# Patient Record
Sex: Male | Born: 1935 | Race: White | Hispanic: No | Smoking: Never smoker
Health system: Southern US, Community
[De-identification: ages and names within clinical notes are randomized; demographics above are authoritative.]

## PROBLEM LIST (undated history)

## (undated) DIAGNOSIS — E039 Hypothyroidism, unspecified: Secondary | ICD-10-CM

## (undated) DIAGNOSIS — L98419 Non-pressure chronic ulcer of buttock with unspecified severity: Secondary | ICD-10-CM

## (undated) DIAGNOSIS — H8109 Meniere's disease, unspecified ear: Secondary | ICD-10-CM

## (undated) DIAGNOSIS — F22 Delusional disorders: Secondary | ICD-10-CM

## (undated) DIAGNOSIS — R001 Bradycardia, unspecified: Secondary | ICD-10-CM

## (undated) DIAGNOSIS — F0390 Unspecified dementia without behavioral disturbance: Secondary | ICD-10-CM

## (undated) DIAGNOSIS — F0150 Vascular dementia without behavioral disturbance: Secondary | ICD-10-CM

## (undated) DIAGNOSIS — M1990 Unspecified osteoarthritis, unspecified site: Secondary | ICD-10-CM

## (undated) DIAGNOSIS — I639 Cerebral infarction, unspecified: Secondary | ICD-10-CM

## (undated) HISTORY — DX: Vascular dementia, unspecified severity, without behavioral disturbance, psychotic disturbance, mood disturbance, and anxiety: F01.50

## (undated) HISTORY — DX: Delusional disorders: F22

## (undated) HISTORY — PX: EXCISIONAL HEMORRHOIDECTOMY: SHX1541

## (undated) HISTORY — DX: Bradycardia, unspecified: R00.1

## (undated) HISTORY — DX: Meniere's disease, unspecified ear: H81.09

## (undated) HISTORY — DX: Non-pressure chronic ulcer of buttock with unspecified severity: L98.419

## (undated) HISTORY — DX: Unspecified osteoarthritis, unspecified site: M19.90

---

## 2019-10-14 ENCOUNTER — Other Ambulatory Visit: Payer: Self-pay

## 2019-10-14 ENCOUNTER — Ambulatory Visit (INDEPENDENT_AMBULATORY_CARE_PROVIDER_SITE_OTHER): Payer: Medicare Other | Admitting: Otolaryngology

## 2019-10-14 ENCOUNTER — Encounter (INDEPENDENT_AMBULATORY_CARE_PROVIDER_SITE_OTHER): Payer: Self-pay | Admitting: Otolaryngology

## 2019-10-14 VITALS — Temp 97.3°F

## 2019-10-14 DIAGNOSIS — H6121 Impacted cerumen, right ear: Secondary | ICD-10-CM | POA: Diagnosis not present

## 2019-10-14 DIAGNOSIS — H6123 Impacted cerumen, bilateral: Secondary | ICD-10-CM | POA: Diagnosis not present

## 2019-10-14 NOTE — Progress Notes (Signed)
HPI: Paul Hopkins is a 86 y.o. male who presents for evaluation of wax buildup worse on the right side referred by [facility]..  No past medical history on file.  Social History   Socioeconomic History  . Marital status: Widowed    Spouse name: Not on file  . Number of children: Not on file  . Years of education: Not on file  . Highest education level: Not on file  Occupational History  . Not on file  Tobacco Use  . Smoking status: Never Smoker  . Smokeless tobacco: Never Used  Substance and Sexual Activity  . Alcohol use: Not on file  . Drug use: Not on file  . Sexual activity: Not on file  Other Topics Concern  . Not on file  Social History Narrative  . Not on file   Social Determinants of Health   Financial Resource Strain:   . Difficulty of Paying Living Expenses:   Food Insecurity:   . Worried About Programme researcher, broadcasting/film/video in the Last Year:   . Barista in the Last Year:   Transportation Needs:   . Freight forwarder (Medical):   Marland Kitchen Lack of Transportation (Non-Medical):   Physical Activity:   . Days of Exercise per Week:   . Minutes of Exercise per Session:   Stress:   . Feeling of Stress :   Social Connections:   . Frequency of Communication with Friends and Family:   . Frequency of Social Gatherings with Friends and Family:   . Attends Religious Services:   . Active Member of Clubs or Organizations:   . Attends Banker Meetings:   Marland Kitchen Marital Status:    No family history on file. Allergies  Allergen Reactions  . Amoxicillin   . Ciprofloxacin   . Sulfa Antibiotics   . Trimethoprim    Prior to Admission medications   Not on File     Positive ROS: Otherwise negative  All other systems have been reviewed and were otherwise negative with the exception of those mentioned in the HPI and as above.  Physical Exam: Constitutional: Alert, well-appearing, no acute distress Ears: External ears without lesions or tenderness. Ear  canals revealed a mild amount of wax on the right side and minimal wax on the left side.  This was cleaned with forceps and curettes.  TMs were otherwise clear.. Nasal: External nose without lesions. Clear nasal passages Oral: Oropharynx clear. Neck: No palpable adenopathy or masses Respiratory: Breathing comfortably  Skin: No facial/neck lesions or rash noted.  Cerumen impaction removal  Date/Time: 10/14/2019 5:22 PM Performed by: Drema Halon, MD Authorized by: Drema Halon, MD   Consent:    Consent obtained:  Verbal   Consent given by:  Patient   Risks discussed:  Pain and bleeding Procedure details:    Location:  L ear and R ear   Procedure type: curette and forceps   Post-procedure details:    Inspection:  TM intact and canal normal   Hearing quality:  Improved   Patient tolerance of procedure:  Tolerated well, no immediate complications Comments:     TMs were clear bilaterally.    Assessment: Cerumen buildup right side worse than left.  Plan: This was cleaned in the office.  He will follow-up as needed.  Narda Bonds, MD

## 2019-11-25 ENCOUNTER — Emergency Department (HOSPITAL_COMMUNITY)
Admission: EM | Admit: 2019-11-25 | Discharge: 2019-11-25 | Disposition: A | Discharge status: Home / Self Care | Payer: Medicare Other | Source: Home / Self Care | Attending: Emergency Medicine | Admitting: Emergency Medicine

## 2019-11-25 ENCOUNTER — Emergency Department (HOSPITAL_COMMUNITY): Payer: Medicare Other

## 2019-11-25 ENCOUNTER — Encounter (HOSPITAL_COMMUNITY): Payer: Self-pay | Admitting: Emergency Medicine

## 2019-11-25 ENCOUNTER — Other Ambulatory Visit: Payer: Self-pay

## 2019-11-25 DIAGNOSIS — R531 Weakness: Secondary | ICD-10-CM

## 2019-11-25 DIAGNOSIS — A419 Sepsis, unspecified organism: Secondary | ICD-10-CM | POA: Diagnosis not present

## 2019-11-25 HISTORY — DX: Cerebral infarction, unspecified: I63.9

## 2019-11-25 HISTORY — DX: Unspecified dementia, unspecified severity, without behavioral disturbance, psychotic disturbance, mood disturbance, and anxiety: F03.90

## 2019-11-25 LAB — COMPREHENSIVE METABOLIC PANEL
ALT: 18 U/L (ref 0–44)
AST: 27 U/L (ref 15–41)
Albumin: 3.9 g/dL (ref 3.5–5.0)
Alkaline Phosphatase: 54 U/L (ref 38–126)
Anion gap: 7 (ref 5–15)
BUN: 32 mg/dL — ABNORMAL HIGH (ref 8–23)
CO2: 30 mmol/L (ref 22–32)
Calcium: 9.6 mg/dL (ref 8.9–10.3)
Chloride: 103 mmol/L (ref 98–111)
Creatinine, Ser: 1.21 mg/dL (ref 0.61–1.24)
GFR calc Af Amer: >60 mL/min (ref >60)
GFR calc non Af Amer: 55 mL/min — ABNORMAL LOW (ref >60)
Glucose, Bld: 117 mg/dL — ABNORMAL HIGH (ref 70–99)
Potassium: 4.7 mmol/L (ref 3.5–5.1)
Sodium: 140 mmol/L (ref 135–145)
Total Bilirubin: 0.9 mg/dL (ref 0.3–1.2)
Total Protein: 6.2 g/dL — ABNORMAL LOW (ref 6.5–8.1)

## 2019-11-25 LAB — CBG MONITORING, ED: Glucose-Capillary: 94 mg/dL (ref 70–99)

## 2019-11-25 LAB — CBC WITH DIFFERENTIAL/PLATELET
Abs Immature Granulocytes: 0.01 10*3/uL (ref 0.00–0.07)
Basophils Absolute: 0 10*3/uL (ref 0.0–0.1)
Basophils Relative: 1 %
Eosinophils Absolute: 0 10*3/uL (ref 0.0–0.5)
Eosinophils Relative: 1 %
HCT: 44.9 % (ref 39.0–52.0)
Hemoglobin: 14.5 g/dL (ref 13.0–17.0)
Immature Granulocytes: 0 %
Lymphocytes Relative: 17 %
Lymphs Abs: 0.8 10*3/uL (ref 0.7–4.0)
MCH: 31.5 pg (ref 26.0–34.0)
MCHC: 32.3 g/dL (ref 30.0–36.0)
MCV: 97.6 fL (ref 80.0–100.0)
Monocytes Absolute: 0.3 10*3/uL (ref 0.1–1.0)
Monocytes Relative: 7 %
Neutro Abs: 3.4 10*3/uL (ref 1.7–7.7)
Neutrophils Relative %: 74 %
Platelets: 196 10*3/uL (ref 150–400)
RBC: 4.6 MIL/uL (ref 4.22–5.81)
RDW: 13.5 % (ref 11.5–15.5)
WBC: 4.6 10*3/uL (ref 4.0–10.5)
nRBC: 0 % (ref 0.0–0.2)

## 2019-11-25 LAB — URINALYSIS, ROUTINE W REFLEX MICROSCOPIC
Bilirubin Urine: NEGATIVE
Glucose, UA: NEGATIVE mg/dL
Hgb urine dipstick: NEGATIVE
Ketones, ur: NEGATIVE mg/dL
Leukocytes,Ua: NEGATIVE
Nitrite: NEGATIVE
Protein, ur: NEGATIVE mg/dL
Specific Gravity, Urine: 1.019 (ref 1.005–1.030)
pH: 6 (ref 5.0–8.0)

## 2019-11-25 MED ORDER — SODIUM CHLORIDE 0.9 % IV SOLN: INTRAVENOUS | Status: DC

## 2019-11-25 NOTE — ED Notes (Signed)
Patient verbalizes understanding of discharge instructions . Opportunity for questions and answers were provided . Armband removed by staff ,Pt discharged from ED. W/C  offered at D/C  and Declined W/C at D/C and was escorted to lobby by RN.  

## 2019-11-25 NOTE — ED Notes (Signed)
Tc to [facility]  forup date on Pt.  CNA reported Pt is ambulatory but needed assistance this AM with a WC to return to his room . When staff returned Pt to his room, it was reported that the room was in disorder. Items in room were tossed around room and room looked ransacked. Pt is known to have dementia . CNA reported Pt does not talk because he is heard of hearing and has to read lips.

## 2019-11-25 NOTE — ED Provider Notes (Signed)
[facility] EMERGENCY DEPARTMENT Provider Note   CSN: [id number] Arrival date & time: 11/25/19  0800     History Chief Complaint  Patient presents with  . Fatigue  . Weakness    Paul Hopkins is a 86 y.o. male.  86 year old male with history of dementia and CVA who presents with EMS after staff at his facility felt that he was weaker than normal.  Patient was able to use his wheelchair and wheel himself out to a meal today with a note that he is in a be somewhat more lethargic.  Patient was noted to be more weak without focality while talking to staff.  Patient himself has no complaints.  No reported history of fever, vomiting.  EMS called and patient CBG was above 100 and he was transported here.        Past Medical History:  Diagnosis Date  . Dementia (HCC)   . Stroke [facility])     There are no problems to display for this patient.        No family history on file.  Social History   Tobacco Use  . Smoking status: Never Smoker  . Smokeless tobacco: Never Used  Substance Use Topics  . Alcohol use: Not on file  . Drug use: Not on file    Home Medications Prior to Admission medications   Not on File    Allergies    Amoxicillin, Ciprofloxacin, Sulfa antibiotics, and Trimethoprim  Review of Systems   Review of Systems  Unable to perform ROS: Dementia    Physical Exam Updated Vital Signs BP (!) 163/77 (BP Location: Right Arm)   Pulse 66   Temp 98.5 F (36.9 C) (Oral)   Resp 17   Physical Exam Vitals and nursing note reviewed.  Constitutional:      General: He is not in acute distress.    Appearance: Normal appearance. He is well-developed. He is not toxic-appearing.  HENT:     Head: Normocephalic and atraumatic.  Eyes:     General: Lids are normal.     Conjunctiva/sclera: Conjunctivae normal.     Pupils: Pupils are equal, round, and reactive to light.  Neck:     Thyroid: No thyroid mass.     Trachea: No tracheal deviation.    Cardiovascular:     Rate and Rhythm: Normal rate and regular rhythm.     Heart sounds: Normal heart sounds. No murmur heard.  No gallop.   Pulmonary:     Effort: Pulmonary effort is normal. No respiratory distress.     Breath sounds: Normal breath sounds. No stridor. No decreased breath sounds, wheezing, rhonchi or rales.  Abdominal:     General: Bowel sounds are normal. There is no distension.     Palpations: Abdomen is soft.     Tenderness: There is no abdominal tenderness. There is no rebound.  Musculoskeletal:        General: No tenderness. Normal range of motion.     Cervical back: Normal range of motion and neck supple.  Skin:    General: Skin is warm and dry.     Findings: No abrasion or rash.  Neurological:     Mental Status: He is alert. He is disoriented.     GCS: GCS eye subscore is 4. GCS verbal subscore is 5. GCS motor subscore is 6.     Cranial Nerves: No cranial nerve deficit.     Sensory: No sensory deficit.     Comments: Patient  withdraws to pain in all 4 extremities  Psychiatric:        Attention and Perception: He is inattentive.        Speech: Speech normal.        Behavior: Behavior normal.     ED Results / Procedures / Treatments   Labs (all labs ordered are listed, but only abnormal results are displayed) Labs Reviewed - No data to display  EKG EKG Interpretation  Date/Time:  Monday November 25 2019 08:11:33 EDT Ventricular Rate:  56 PR Interval:    QRS Duration: 163 QT Interval:  475 QTC Calculation: 459 R Axis:   -85 Text Interpretation: Sinus rhythm Right bundle branch block Left ventricular hypertrophy Anterior infarct, old No old tracing to compare Confirmed by Lacretia Leigh ([id number]) on 11/25/2019 8:19:01 AM   Radiology No results found.  Procedures Procedures (including critical care time)  Medications Ordered in ED Medications  0.9 %  sodium chloride infusion (has no administration in time range)    ED Course  I have reviewed the  triage vital signs and the nursing notes.  Pertinent labs & imaging results that were available during my care of the patient were reviewed by me and considered in my medical decision making (see chart for details).    MDM Rules/Calculators/A&P                          Patient's evaluation here including head CT and urinalysis without acute findings.  According to the patient's daughter, he is at his baseline.  Suspect that his underlying dementia is contributing to this.  Will discharge Final Clinical Impression(s) / ED Diagnoses Final diagnoses:  None    Rx / DC Orders ED Discharge Orders    None       Lacretia Leigh, MD 11/25/19 1044

## 2019-11-25 NOTE — ED Triage Notes (Signed)
Pt here via ems from [facility] where staff said pt is lethargic and weak. Pt hx of dementia and stroke. Pt uses a wheelchair and rolled himself to the doors this morning and was talking to security and staff noticed he was weak. Pt has no complaints. Afebrile with EMS.

## 2019-11-26 LAB — URINE CULTURE: Culture: NO GROWTH

## 2019-11-27 ENCOUNTER — Emergency Department (HOSPITAL_COMMUNITY): Payer: Medicare Other

## 2019-11-27 ENCOUNTER — Inpatient Hospital Stay (HOSPITAL_COMMUNITY)
Admission: AD | Admit: 2019-11-27 | Discharge: 2019-11-29 | DRG: 872 | Disposition: A | Discharge status: Home / Self Care | Payer: Medicare Other | Attending: Family Medicine | Admitting: Family Medicine

## 2019-11-27 DIAGNOSIS — Z79890 Hormone replacement therapy (postmenopausal): Secondary | ICD-10-CM

## 2019-11-27 DIAGNOSIS — E039 Hypothyroidism, unspecified: Secondary | ICD-10-CM | POA: Diagnosis present

## 2019-11-27 DIAGNOSIS — F0150 Vascular dementia without behavioral disturbance: Secondary | ICD-10-CM | POA: Diagnosis present

## 2019-11-27 DIAGNOSIS — R17 Unspecified jaundice: Secondary | ICD-10-CM | POA: Diagnosis present

## 2019-11-27 DIAGNOSIS — R5383 Other fatigue: Secondary | ICD-10-CM | POA: Diagnosis present

## 2019-11-27 DIAGNOSIS — N390 Urinary tract infection, site not specified: Secondary | ICD-10-CM | POA: Diagnosis present

## 2019-11-27 DIAGNOSIS — Z66 Do not resuscitate: Secondary | ICD-10-CM | POA: Diagnosis present

## 2019-11-27 DIAGNOSIS — A4152 Sepsis due to Pseudomonas: Secondary | ICD-10-CM | POA: Diagnosis not present

## 2019-11-27 DIAGNOSIS — Z7189 Other specified counseling: Secondary | ICD-10-CM

## 2019-11-27 DIAGNOSIS — B965 Pseudomonas (aeruginosa) (mallei) (pseudomallei) as the cause of diseases classified elsewhere: Secondary | ICD-10-CM | POA: Diagnosis present

## 2019-11-27 DIAGNOSIS — A498 Other bacterial infections of unspecified site: Secondary | ICD-10-CM | POA: Diagnosis present

## 2019-11-27 DIAGNOSIS — A419 Sepsis, unspecified organism: Secondary | ICD-10-CM

## 2019-11-27 DIAGNOSIS — Z20822 Contact with and (suspected) exposure to covid-19: Secondary | ICD-10-CM | POA: Diagnosis present

## 2019-11-27 DIAGNOSIS — Z79899 Other long term (current) drug therapy: Secondary | ICD-10-CM | POA: Diagnosis not present

## 2019-11-27 DIAGNOSIS — F0151 Vascular dementia with behavioral disturbance: Secondary | ICD-10-CM | POA: Diagnosis not present

## 2019-11-27 DIAGNOSIS — Z515 Encounter for palliative care: Secondary | ICD-10-CM | POA: Diagnosis present

## 2019-11-27 DIAGNOSIS — R609 Edema, unspecified: Secondary | ICD-10-CM

## 2019-11-27 DIAGNOSIS — H9190 Unspecified hearing loss, unspecified ear: Secondary | ICD-10-CM | POA: Diagnosis present

## 2019-11-27 DIAGNOSIS — Z808 Family history of malignant neoplasm of other organs or systems: Secondary | ICD-10-CM | POA: Diagnosis not present

## 2019-11-27 DIAGNOSIS — Z8673 Personal history of transient ischemic attack (TIA), and cerebral infarction without residual deficits: Secondary | ICD-10-CM

## 2019-11-27 DIAGNOSIS — K7460 Unspecified cirrhosis of liver: Secondary | ICD-10-CM

## 2019-11-27 DIAGNOSIS — F01518 Vascular dementia, unspecified severity, with other behavioral disturbance: Secondary | ICD-10-CM

## 2019-11-27 DIAGNOSIS — N3000 Acute cystitis without hematuria: Secondary | ICD-10-CM

## 2019-11-27 DIAGNOSIS — R188 Other ascites: Secondary | ICD-10-CM | POA: Diagnosis present

## 2019-11-27 HISTORY — DX: Hypothyroidism, unspecified: E03.9

## 2019-11-27 LAB — URINALYSIS, ROUTINE W REFLEX MICROSCOPIC
Bilirubin Urine: NEGATIVE
Glucose, UA: NEGATIVE mg/dL
Ketones, ur: NEGATIVE mg/dL
Nitrite: NEGATIVE
Protein, ur: 100 mg/dL — AB
Specific Gravity, Urine: 1.026 (ref 1.005–1.030)
WBC, UA: >50 WBC/hpf — ABNORMAL HIGH (ref 0–5)
pH: 5 (ref 5.0–8.0)

## 2019-11-27 LAB — LACTIC ACID, PLASMA
Lactic Acid, Venous: 2.7 mmol/L (ref 0.5–1.9)
Lactic Acid, Venous: 2 mmol/L (ref 0.5–1.9)

## 2019-11-27 LAB — CBC WITH DIFFERENTIAL/PLATELET
Abs Immature Granulocytes: 0.28 10*3/uL — ABNORMAL HIGH (ref 0.00–0.07)
Basophils Absolute: 0.1 10*3/uL (ref 0.0–0.1)
Basophils Relative: 0 %
Eosinophils Absolute: 0 10*3/uL (ref 0.0–0.5)
Eosinophils Relative: 0 %
HCT: 43.5 % (ref 39.0–52.0)
Hemoglobin: 14 g/dL (ref 13.0–17.0)
Immature Granulocytes: 1 %
Lymphocytes Relative: 1 %
Lymphs Abs: 0.1 10*3/uL — ABNORMAL LOW (ref 0.7–4.0)
MCH: 31.4 pg (ref 26.0–34.0)
MCHC: 32.2 g/dL (ref 30.0–36.0)
MCV: 97.5 fL (ref 80.0–100.0)
Monocytes Absolute: 2 10*3/uL — ABNORMAL HIGH (ref 0.1–1.0)
Monocytes Relative: 9 %
Neutro Abs: 20.7 10*3/uL — ABNORMAL HIGH (ref 1.7–7.7)
Neutrophils Relative %: 89 %
Platelets: 189 10*3/uL (ref 150–400)
RBC: 4.46 MIL/uL (ref 4.22–5.81)
RDW: 13.5 % (ref 11.5–15.5)
WBC: 23.1 10*3/uL — ABNORMAL HIGH (ref 4.0–10.5)
nRBC: 0 % (ref 0.0–0.2)

## 2019-11-27 LAB — COMPREHENSIVE METABOLIC PANEL
ALT: 19 U/L (ref 0–44)
AST: 38 U/L (ref 15–41)
Albumin: 3.3 g/dL — ABNORMAL LOW (ref 3.5–5.0)
Alkaline Phosphatase: 52 U/L (ref 38–126)
Anion gap: 11 (ref 5–15)
BUN: 31 mg/dL — ABNORMAL HIGH (ref 8–23)
CO2: 26 mmol/L (ref 22–32)
Calcium: 9.3 mg/dL (ref 8.9–10.3)
Chloride: 99 mmol/L (ref 98–111)
Creatinine, Ser: 1.3 mg/dL — ABNORMAL HIGH (ref 0.61–1.24)
GFR calc Af Amer: 58 mL/min — ABNORMAL LOW (ref >60)
GFR calc non Af Amer: 50 mL/min — ABNORMAL LOW (ref >60)
Glucose, Bld: 156 mg/dL — ABNORMAL HIGH (ref 70–99)
Potassium: 4.1 mmol/L (ref 3.5–5.1)
Sodium: 136 mmol/L (ref 135–145)
Total Bilirubin: 1.9 mg/dL — ABNORMAL HIGH (ref 0.3–1.2)
Total Protein: 5.8 g/dL — ABNORMAL LOW (ref 6.5–8.1)

## 2019-11-27 LAB — SARS CORONAVIRUS 2 BY RT PCR (HOSPITAL ORDER, PERFORMED IN ~~LOC~~ HOSPITAL LAB): SARS Coronavirus 2: NEGATIVE

## 2019-11-27 LAB — APTT: aPTT: 29 seconds (ref 24–36)

## 2019-11-27 LAB — PROTIME-INR
INR: 1.2 (ref 0.8–1.2)
Prothrombin Time: 15.2 seconds (ref 11.4–15.2)

## 2019-11-27 LAB — CBG MONITORING, ED: Glucose-Capillary: 141 mg/dL — ABNORMAL HIGH (ref 70–99)

## 2019-11-27 LAB — LIPASE, BLOOD: Lipase: 23 U/L (ref 11–51)

## 2019-11-27 MED ORDER — SODIUM CHLORIDE 0.9 % IV SOLN: INTRAVENOUS | Status: AC

## 2019-11-27 MED ORDER — LEVOTHYROXINE SODIUM 88 MCG PO TABS
88.0000 ug | ORAL_TABLET | Freq: Every day | ORAL | Status: DC
  Administered 2019-11-28 – 2019-11-29 (×2): 88 ug via ORAL
  Filled 2019-11-27 (×2): qty 1

## 2019-11-27 MED ORDER — SODIUM CHLORIDE 0.9 % IV BOLUS
500.0000 mL | Freq: Once | INTRAVENOUS | Status: AC
  Administered 2019-11-27: 500 mL via INTRAVENOUS

## 2019-11-27 MED ORDER — SODIUM CHLORIDE 0.9 % IV BOLUS
1000.0000 mL | Freq: Once | INTRAVENOUS | Status: AC
  Administered 2019-11-27: 1000 mL via INTRAVENOUS

## 2019-11-27 MED ORDER — ACETAMINOPHEN 325 MG PO TABS: 650.0000 mg | ORAL_TABLET | Freq: Four times a day (QID) | ORAL | Status: DC | PRN

## 2019-11-27 MED ORDER — ARIPIPRAZOLE 5 MG PO TABS
10.0000 mg | ORAL_TABLET | Freq: Every day | ORAL | Status: DC
  Administered 2019-11-28 – 2019-11-29 (×2): 10 mg via ORAL
  Filled 2019-11-27 (×2): qty 2

## 2019-11-27 MED ORDER — ENOXAPARIN SODIUM 40 MG/0.4ML ~~LOC~~ SOLN
40.0000 mg | SUBCUTANEOUS | Status: DC
  Administered 2019-11-28 – 2019-11-29 (×2): 40 mg via SUBCUTANEOUS
  Filled 2019-11-27 (×3): qty 0.4

## 2019-11-27 MED ORDER — ACETAMINOPHEN 325 MG PO TABS
650.0000 mg | ORAL_TABLET | Freq: Once | ORAL | Status: AC
  Administered 2019-11-27: 650 mg via ORAL
  Filled 2019-11-27: qty 2

## 2019-11-27 MED ORDER — SODIUM CHLORIDE 0.9 % IV SOLN
1.0000 g | Freq: Once | INTRAVENOUS | Status: AC
  Administered 2019-11-27: 1 g via INTRAVENOUS
  Filled 2019-11-27: qty 10

## 2019-11-27 MED ORDER — SODIUM CHLORIDE 0.9 % IV SOLN: Freq: Once | INTRAVENOUS | Status: AC

## 2019-11-27 NOTE — ED Triage Notes (Signed)
Per [facility] pt seen here 3 days ago for ongoing weakness, lethargy, somnolent, dysuria, less communication with staff. They are concerned for possible UTI   BP 120/60   HR 80 RR 40 100.7 temp CBG 169 97% 2L [place]

## 2019-11-27 NOTE — ED Provider Notes (Signed)
[facility] EMERGENCY DEPARTMENT Provider Note   CSN: [id number] Arrival date & time: 11/27/19  1500     History Chief Complaint  Patient presents with  . Altered Mental Status    Gualberto Wahlen is a 86 y.o. male.  The history is provided by the patient.  Altered Mental Status Presenting symptoms: lethargy   Severity:  Mild Most recent episode:  Yesterday Episode history:  Single Timing:  Constant Progression:  Unchanged Chronicity:  New Context: dementia and nursing home resident   Associated symptoms: fever (possibly) and weakness   Associated symptoms: no abdominal pain, no nausea, no palpitations, no rash, no seizures and no vomiting        Past Medical History:  Diagnosis Date  . Dementia (HCC)   . Stroke [facility])     There are no problems to display for this patient.       No family history on file.  Social History   Tobacco Use  . Smoking status: Never Smoker  . Smokeless tobacco: Never Used  Substance Use Topics  . Alcohol use: Not on file  . Drug use: Not on file    Home Medications Prior to Admission medications   Not on File    Allergies    Amoxicillin, Ciprofloxacin, Sulfa antibiotics, and Trimethoprim  Review of Systems   Review of Systems  Constitutional: Positive for fever (possibly). Negative for chills.  HENT: Negative for ear pain and sore throat.   Eyes: Negative for pain and visual disturbance.  Respiratory: Negative for cough and shortness of breath.   Cardiovascular: Negative for chest pain and palpitations.  Gastrointestinal: Positive for constipation (possibly). Negative for abdominal pain, diarrhea, nausea and vomiting.  Genitourinary: Negative for dysuria and hematuria.  Musculoskeletal: Negative for arthralgias and back pain.  Skin: Negative for color change and rash.  Neurological: Positive for weakness. Negative for seizures and syncope.  All other systems reviewed and are negative.   Physical  Exam Updated Vital Signs  ED Triage Vitals [11/27/19 1512]  Enc Vitals Group     BP (!) 115/50     Pulse Rate 82     Resp 20     Temp (!) 100.4 F (38 C)     Temp Source Oral     SpO2 98 %     Weight      Height      Head Circumference      Peak Flow      Pain Score      Pain Loc      Pain Edu?      Excl. in GC?     Physical Exam Vitals and nursing note reviewed.  Constitutional:      General: He is not in acute distress.    Appearance: He is well-developed. He is not ill-appearing.  HENT:     Head: Normocephalic and atraumatic.     Nose: Nose normal.     Mouth/Throat:     Mouth: Mucous membranes are moist.  Eyes:     Extraocular Movements: Extraocular movements intact.     Conjunctiva/sclera: Conjunctivae normal.     Pupils: Pupils are equal, round, and reactive to light.  Cardiovascular:     Rate and Rhythm: Normal rate and regular rhythm.     Pulses: Normal pulses.     Heart sounds: Normal heart sounds. No murmur heard.   Pulmonary:     Effort: Pulmonary effort is normal. No respiratory distress.  Breath sounds: Normal breath sounds.  Abdominal:     Palpations: Abdomen is soft.     Tenderness: There is no abdominal tenderness.  Musculoskeletal:     Cervical back: Neck supple.  Skin:    General: Skin is warm and dry.  Neurological:     General: No focal deficit present.     Mental Status: He is alert.     Comments: Moves all extremities, appears to be at baseline.  Pupils equal     ED Results / Procedures / Treatments   Labs (all labs ordered are listed, but only abnormal results are displayed) Labs Reviewed  LACTIC ACID, PLASMA - Abnormal; Notable for the following components:      Result Value   Lactic Acid, Venous 2.7 (*)    All other components within normal limits  LACTIC ACID, PLASMA - Abnormal; Notable for the following components:   Lactic Acid, Venous 2.0 (*)    All other components within normal limits  COMPREHENSIVE METABOLIC PANEL  - Abnormal; Notable for the following components:   Glucose, Bld 156 (*)    BUN 31 (*)    Creatinine, Ser 1.30 (*)    Total Protein 5.8 (*)    Albumin 3.3 (*)    Total Bilirubin 1.9 (*)    GFR calc non Af Amer 50 (*)    GFR calc Af Amer 58 (*)    All other components within normal limits  CBC WITH DIFFERENTIAL/PLATELET - Abnormal; Notable for the following components:   WBC 23.1 (*)    Neutro Abs 20.7 (*)    Lymphs Abs 0.1 (*)    Monocytes Absolute 2.0 (*)    Abs Immature Granulocytes 0.28 (*)    All other components within normal limits  URINALYSIS, ROUTINE W REFLEX MICROSCOPIC - Abnormal; Notable for the following components:   APPearance CLOUDY (*)    Hgb urine dipstick MODERATE (*)    Protein, ur 100 (*)    Leukocytes,Ua LARGE (*)    WBC, UA >50 (*)    Bacteria, UA MANY (*)    All other components within normal limits  CBG MONITORING, ED - Abnormal; Notable for the following components:   Glucose-Capillary 141 (*)    All other components within normal limits  CULTURE, BLOOD (ROUTINE X 2)  URINE CULTURE  SARS CORONAVIRUS 2 BY RT PCR (HOSPITAL ORDER, [facility])  APTT  PROTIME-INR    EKG EKG Interpretation  Date/Time:  Wednesday November 27 2019 16:23:09 EDT Ventricular Rate:  76 PR Interval:  162 QRS Duration: 140 QT Interval:  372 QTC Calculation: 419 R Axis:   -80 Text Interpretation: Unknown rhythm, irregular rate Right bundle branch block Left ventricular hypertrophy Inferior infarct, old Anterior infarct, old Confirmed by Lennice Sites [id number]) on 11/27/2019 4:33:08 PM   Radiology DG Chest Port 1 View  Result Date: 11/27/2019 CLINICAL DATA:  Weakness, lethargy, decreased communication. EXAM: PORTABLE CHEST 1 VIEW COMPARISON:  Chest radiograph 11/25/2019 FINDINGS: Stable cardiomediastinal contours. The lungs are clear. No pneumothorax or significant pleural effusion. No acute finding in the visualized skeleton. IMPRESSION: No acute  cardiopulmonary process. Electronically Signed   By: Audie Pinto M.D.   On: 11/27/2019 15:55    Procedures Procedures (including critical care time)  Medications Ordered in ED Medications  0.9 %  sodium chloride infusion (has no administration in time range)  acetaminophen (TYLENOL) tablet 650 mg (650 mg Oral Given 11/27/19 1630)  sodium chloride 0.9 % bolus 1,000  mL (0 mLs Intravenous Stopped 11/27/19 1824)  cefTRIAXone (ROCEPHIN) 1 g in sodium chloride 0.9 % 100 mL IVPB (0 g Intravenous Stopped 11/27/19 1730)  sodium chloride 0.9 % bolus 500 mL (500 mLs Intravenous New Bag/Given 11/27/19 1841)    ED Course  I have reviewed the triage vital signs and the nursing notes.  Pertinent labs & imaging results that were available during my care of the patient were reviewed by me and considered in my medical decision making (see chart for details).    MDM Rules/Calculators/A&P                          Tracy Kinner is an 86 year old male with history of dementia who presents to the ED with altered mental status.  Patient with fever of 100.4 but otherwise normal vitals.  Seen here several days ago for the same and had unremarkable work-up.  Family member states that patient still seems lethargic, seems to be sweating and having chills.  Vaccinated against coronavirus.  Patient overall does not have any specific complaints.  Denies any chest pain, shortness of breath, abdominal pain.  No abdominal tenderness on exam.  Appears to be at his neurologic baseline.  He is lethargic but he is able to participate in exam and questioning.  Given fever suspect likely cause of his lethargy over the last several days.  We will recheck for infection in urine and chest.  Will check Covid.  Does not appear to have any abdominal tenderness.  No ulcers.  Vital signs not suggestive of sepsis at this time however will get infectious work-up.  Will give fluid bolus and IV Rocephin.  Patient with lactic acid of 2.7,  white count of 23.  Urinalysis appears consistent with a UTI.  Otherwise no significant AKI or electrolyte abnormality.  Chest x-ray without signs of infection.  CT scan abdomen and pelvis showed some periportal edema and trace pericholecystic fluid but overall nonspecific.  Could be underlying liver disease.  Does not appear to be particularly tender in this area.  Liver enzymes are unremarkable.  Bilirubin is 1.9.  No obvious gallstones on CT scan.  Pancreas looks unremarkable on CT as well.  Will discuss right upper quadrant ultrasound need with inpatient team.  However suspect infection from UTI.  This chart was dictated using voice recognition software.  Despite best efforts to proofread,  errors can occur which can change the documentation meaning.    Final Clinical Impression(s) / ED Diagnoses Final diagnoses:  Sepsis, due to unspecified organism, unspecified whether acute organ dysfunction present [facility])  Acute cystitis without hematuria    Rx / DC Orders ED Discharge Orders    None       Virgina Norfolk, DO 11/27/19 1945

## 2019-11-27 NOTE — H&P (Addendum)
[facility] Admission History and Physical Service Pager: [phone]  Patient name: Paul Hopkins Medical record number: [id number] Date of birth: Aug 04, 1933 Age: 86 y.o. Gender: male  Primary Care Provider: Patient, No Pcp Per Consultants: None Code Status: DNI, see below Preferred Emergency Contact: [name], daughter, [phone]  Chief Complaint: Altered mental status  Assessment and Plan: Paul Hopkins is a 86 y.o. male presenting with altered mental status. PMH is significant for dementia, hypothyroidism, delusional disorder, Mnire's disease.  Lethargy with fever meeting SIRS criteria Patient presented to the ED from SNF with altered mental status.  Patient's daughter reports that he had been complaining of needing to pee but being unable to do so.  Febrile to 100.4.  Patient was recently seen in the emergency department and had work-up for altered mental status which there were no acute findings, thought to be related to patient's dementia.  Per patient's family he does seem more lethargic than baseline.  A urine was collected which was consistent with a UTI.  A lactic acid and CBC were also collected and were significant for a lactic acid of 2.7 which was trended to 2.0 after 1.5L NS, and WBC 23.  CXR was negative for any findings of acute infection.  CT abdomen and pelvis showed mild periportal edema and trace pericholecystic fluid but was otherwise normal.  CMP showed normal LFTs and mildly elevated bilirubin at 1.9.  No visible gallstones on the CT scan and pancreas was also unremarkable.  Right upper quadrant ultrasound showed small volume ascites in the right upper quadrant including trace pericholecystic fluid.  No gallstones, biliary dilation, or focal liver abnormalities identified.  The top of my differential for this patient is symptomatic UTI/urosepsis given he was complaining of what sounds like the urge to urinate with being unable to do so.   Urinalysis is also consistent with UTI.  Regarding other sources of infection pneumonia is less likely given chest x-ray.  No indication that CT and right upper quadrant ultrasound findings showing pericholecystic fluid collection have clinical significance regarding patient's SIRS picture.  Patient has no signs of aacalculus cholecystitis with no right upper quadrant tenderness, no biliary dilation.  No signs of skin infection, no sacral wound on examination.  Would not broaden patient's antibiotics at this time, as most likely source of fever is urine given possible symptoms and UA, patient is also no longer afebrile.  Should patient fever overnight or status decline, would consider broadening to vanc/cefepime for suspected sepsis treatment, as met criteria based for SIRS on admission based on temp and WBC with elevated LA, now trended to normal.  Patient is slightly dry on exam, now being fed by daughter. Received 1.5L NS per bolus.  It looks in the chart as though patient also received 100cc/hr x1.  Will add on 100cc/hr NS x4 hrs to complete 2L fluid resuscitation, as pressures also on softer side.  -Admit to inpatient teaching service with Dr. Owens Shark as attending -Vitals per routine - s/p CTX (6/16) - NS 100cc/hr x 4 hrs - f/u urine cx - f/u blood cx -Consider reevaluating antibiotics broadening if status worsens -Morning CBC -Morning CMP -PT/OT eval and treat -Continuous pulse ox -Fall precautions -Regular diet (see dietary restrictions note from patient's daughter)  Code Status See HPI for further details regarding discussion with [name] and Casimer Bilis.  They both agree that Casimer Bilis is Education officer, community.  Casimer Bilis and [place] both agree that patient would want chest compressions but would not want to be  intubated.  They both also state that this is not explicitly stated in his living will.  This information does not seem to be on file with [facility] per discussion with nurse Caryl Pina there this  evening.  Given this confusion and no clear documentation, will consult palliative care in the morning to ensure that CODE STATUS is appropriately documented for the family in the future.  Given family's wishes for patient's CODE STATUS, patient will be made a DNI.  Both Casimer Bilis and [name] are aware that palliative care may contact them in the morning in regards to [place]. - consult palliative for code status - DNI  Hypothyroidism Patient takes levothyroxine at 88 mcg daily at home. -Continue home levothyroxine -Check TSH  Vascular dementia   delusional disorder Patient is oriented to person, place, time.  Per patient's daughter his vascular dementia has affected his personality more than anything else.  He also has a diagnosis of delusional disorder per patient's daughter.  He was concerned that "hate groups were after him".  Home medications include Abilify 10 mg daily. -Continue home Abilify  History of CVA Patient's daughter reports history of CVA in 2017.  He has been resistant to any medication treatments.  He is stable at this time with no focal neurologic deficits. -Follow-up outpatient  History of Mnire's disease Stable at this time, patient with hearing deficit. -No inpatient management necessary  FEN/GI: Regular diet (please see note on dietary restrictions and allergies) Prophylaxis: Lovenox  Disposition: Admit to inpatient teaching service, will consult social work as patient presented from [place]  History of Present Illness:  Paul Hopkins is a 86 y.o. male presenting with lethargy and change from mental baseline from SNF.  Per daughter and ED provider report, patient was not acting himself this AM and was more tired than usual.  He reports having a chronic cough.  States that he has not been having dysuria.  Denies knowing why he is at the hospital today.  Denies polyuria, hematuria.  Endorses runny nose and congestion, doesn't know how long.  Denies shortness  of breath.  Denies stomach pain.  Denies chest pain.  Denies leg edema.  Daughter presents to bedside and states that 2 days ago he was brought to ED because she "rearranged his entire room."  At that time, patient was found to have a normal work up and was discharged home. Today, daughter states she was told that he was not acting himself this AM.  She states that this morning he was stating that he needed to go to the restroom, then when they would take him he would not urinate.  She states that at baseline, he can usually have a conversation, she questions whether or not he answers questions about how he is feeling appropriately.  She states that he is usually A&O x4, knows who he is, where he is, the year, other current events.  He walks with a walker.  She states that he frequently cannot make appropriate decisions however.  His daughter [name] states that she has his financial power of attorney, but his medical power of attorney is his brother Casimer Bilis, who lives in [place].  She would like to be his primary contact as she is closer.  Documentation at bedside from [place] has both parties listed as contacts.  His daughter reports that he just moved from [place] to [place] in March.  Prior to November 2020, patient was living independently.  She reports a history of Mnire's disease, a delusional  disorder, CVA in 2017.  She states overall the patient is very resistant to medications.  He is currently only on Abilify and Synthroid.  Daughter reports that patient has a code status on file and she states that to her knowledge her would not want any aggressive measures.  Per the daughter's report, Daughter [name] is Financial POA, Brother Casimer Bilis is healthcare POA, but lives in [place], [place].  Paperwork at bedside appears to read Elisa as healthcare and financial POA, but she states that this is not true.  Both [name] and brother Casimer Bilis agree that Casimer Bilis is primary Scientist, research (medical).   Called and spoke with Casimer Bilis.  Casimer Bilis reports that patient's wishes would be to have chest compressions, but to not be intubated.  [name] also reported the same.    Called [facility] and spoke with nurse Caryl Pina.  She verified that he does not have a DNR on file at [facility] and does not have any other code status on file, therefore at their facility he would be treated as a full code.    Review Of Systems: Per HPI with the following additions: Review of Systems  Constitutional: Positive for fatigue and fever.  HENT: Positive for congestion.   Eyes: Negative for visual disturbance.  Respiratory: Positive for cough. Negative for shortness of breath.   Cardiovascular: Positive for leg swelling. Negative for chest pain.  Gastrointestinal: Negative for abdominal pain, blood in stool, constipation, diarrhea, nausea and vomiting.  Endocrine: Negative for polyuria.  Genitourinary: Negative for dysuria, frequency and urgency.  Skin: Negative for rash.  Neurological: Negative for dizziness and weakness.     Patient Active Problem List   Diagnosis Date Noted   Sepsis ([place]) 11/27/2019    Past Medical History: Past Medical History:  Diagnosis Date   Dementia ([place])    Stroke [facility])     Past Surgical History: hemorrhiods, cataract, tonsillectomy, no abdominal surgeries  Social History: Social History   Tobacco Use   Smoking status: Never Smoker   Smokeless tobacco: Never Used  Substance Use Topics   Alcohol use: Not on file   Drug use: Not on file   Additional social history: Never smoker, does not drink, no drug use  Please also refer to relevant sections of EMR.  Family History: No family history on file.  Father died of melanoma Mother had a heart condition   Allergies and Medications: Allergies  Allergen Reactions   Amoxicillin Other (See Comments)    Reaction??   Banana Other (See Comments)    Hip pain   Bean Pod Extract Diarrhea and Other (See Comments)     Explosive diarrhea   Blackberry [Rubus Fruticosus] Other (See Comments)    Flares the patient's Mnire's disease   Carrot Oil Diarrhea and Other (See Comments)    Explosive diarrhea   Ciprofloxacin Other (See Comments)    Reaction??   Citric Acid Diarrhea and Other (See Comments)    Explosive diarrhea   Citrus Diarrhea and Other (See Comments)    Explosive diarrhea   Corn-Containing Products Other (See Comments)    Explosive diarrhea   Egg [Eggs Or Egg-Derived Products] Diarrhea and Other (See Comments)    Explosive diarrhea   Milk-Related Compounds Diarrhea and Other (See Comments)    Explosive diarrhea   Other Diarrhea and Other (See Comments)    NO BERRIES WITH TINY SEEDS: Flares the patient's Mnire's disease  Bread- Explosive diarrhea   Peanut-Containing Drug Products Diarrhea and Other (See Comments)    NO  LEGUMES!! NO PEAS!! Explosive diarrhea   Raspberry Other (See Comments)    Flares the patient's Mnire's disease   Strawberry Extract Other (See Comments)    Flares the patient's Mnire's disease   Sulfa Antibiotics Other (See Comments)    Reaction??   Trimethoprim Other (See Comments)    Reaction??   Whey Diarrhea and Other (See Comments)    Explosive diarrhea   No current facility-administered medications on file prior to encounter.   Current Outpatient Medications on File Prior to Encounter  Medication Sig Dispense Refill   ARIPiprazole (ABILIFY) 10 MG tablet Take 10 mg by mouth daily.     Digestive Enzyme CAPS Take 1 capsule by mouth See admin instructions. Take 1 capsule by mouth at 5 PM daily     levothyroxine (SYNTHROID) 88 MCG tablet Take 88 mcg by mouth daily before breakfast.     SODIUM FLUORIDE PO 30 mLs by Mouth Rinse route in the morning.      Objective: BP (!) 113/55 (BP Location: Left Arm)    Pulse 72    Temp 99.3 F (37.4 C) (Oral)    Resp (!) 25    SpO2 100%  Physical Exam Constitutional:      General: He is not in acute  distress. HENT:     Head: Normocephalic and atraumatic.     Nose: No congestion or rhinorrhea.     Mouth/Throat:     Mouth: Mucous membranes are moist.  Eyes:     General: No scleral icterus.    Extraocular Movements: Extraocular movements intact.     Pupils: Pupils are equal, round, and reactive to light.  Cardiovascular:     Rate and Rhythm: Normal rate. Rhythm irregular.  Pulmonary:     Effort: Pulmonary effort is normal.     Breath sounds: Normal breath sounds.  Abdominal:     General: Abdomen is flat. Bowel sounds are normal. There is no distension.     Palpations: There is no mass.     Tenderness: There is no abdominal tenderness. There is no guarding.  Genitourinary:    Prostate: Normal.     Comments: Normal sphincter tone, no tenderness noted, no nodules or abnormal masses noted  Musculoskeletal:     Cervical back: Neck supple. Tenderness present. No rigidity.     Comments: Trace lower extremity edema bilaterally  Skin:    General: Skin is warm and dry.     Findings: No erythema, lesion or rash.  Neurological:     General: No focal deficit present.     Mental Status: He is alert.     Cranial Nerves: No cranial nerve deficit.     Sensory: No sensory deficit.     Motor: No weakness.     Comments: Patient is alert and oriented to person place, time and president   Addendum to above: specifically no sacral wound noted on examination  Labs and Imaging: CBC BMET  Recent Labs  Lab 11/27/19 1540  WBC 23.1*  HGB 14.0  HCT 43.5  PLT 189   Recent Labs  Lab 11/27/19 1540  NA 136  K 4.1  CL 99  CO2 26  BUN 31*  CREATININE 1.30*  GLUCOSE 156*  CALCIUM 9.3     EKG: Right bundle branch block  Results for KENYA, SHIRAISHI (MRN [id number]) as of 11/27/2019 22:01  Ref. Range 11/27/2019 18:27  Appearance Latest Ref Range: CLEAR  CLOUDY (A)  Bilirubin Urine Latest Ref Range: NEGATIVE  NEGATIVE  Color,  Urine Latest Ref Range: YELLOW  YELLOW  Glucose, UA Latest Ref  Range: NEGATIVE mg/dL NEGATIVE  Hgb urine dipstick Latest Ref Range: NEGATIVE  MODERATE (A)  Ketones, ur Latest Ref Range: NEGATIVE mg/dL NEGATIVE  Leukocytes,Ua Latest Ref Range: NEGATIVE  LARGE (A)  Nitrite Latest Ref Range: NEGATIVE  NEGATIVE  pH Latest Ref Range: 5.0 - 8.0  5.0  Protein Latest Ref Range: NEGATIVE mg/dL 100 (A)  Specific Gravity, Urine Latest Ref Range: 1.005 - 1.030  1.026  Bacteria, UA Latest Ref Range: NONE SEEN  MANY (A)  Mucus Unknown PRESENT  RBC / HPF Latest Ref Range: 0 - 5 RBC/hpf 11-20  WBC, UA Latest Ref Range: 0 - 5 WBC/hpf >50 (H)   Lactic acid-2.7> 2.0 PT INR-15.2/1.2 APTT 29  Hepatic Function Latest Ref Rng & Units 11/27/2019 11/25/2019  Total Protein 6.5 - 8.1 g/dL 5.8(L) 6.2(L)  Albumin 3.5 - 5.0 g/dL 3.3(L) 3.9  AST 15 - 41 U/L 38 27  ALT 0 - 44 U/L 19 18  Alk Phosphatase 38 - 126 U/L 52 54  Total Bilirubin 0.3 - 1.2 mg/dL 1.9(H) 0.9    Gifford Shave, MD 11/27/2019, 10:31 PM PGY-1, [facility] pager: [phone], text pages welcome  FPTS Upper-Level Resident Addendum   I have independently interviewed and examined the patient. I have discussed the above with the original author and agree with their documentation. My edits for correction/addition/clarification are in green. Please see also any attending notes.   [place] Constable, D.O. PGY-2, [facility] 11/27/2019 10:53 PM  [facility] pager: [phone] (text pages welcome through [place])

## 2019-11-27 NOTE — Progress Notes (Signed)
Patient's daughter has a list of patient's food allergies.  Below is what is included on the paper:  Food allergies:  All dairy, and anything with dairy product such as milk, whey, or eggs in the ingredients (which includes most breads)  All legumes, including beans, peas, and peanuts  The following vegetables: Corn, and anything with corn syrup and corn oil as an ingredient Carrots  The following fruits: Citrus and anything with citric acid as an ingredient Bananas Berries with tiny seeds, examples strawberries, raspberries, blackberries (he can eat blueberries)  Notes: He eats fruits and may be some vegetables on a 4-day rotation by family.  For example, he only eats fruits in the apple family 1 day out of every 4.  He also rotates grains, like oats and wheat (he was allergic to the latter years ago but may not be any more), and pork.  The rotation diet prevents him from developing further food intolerances due to eating too much of something or eating something too often.  If there are any questions about the above, please call the patient's daughter Abou Sterkel at [phone].

## 2019-11-28 ENCOUNTER — Other Ambulatory Visit: Payer: Self-pay

## 2019-11-28 ENCOUNTER — Encounter (HOSPITAL_COMMUNITY): Payer: Self-pay | Admitting: Family Medicine

## 2019-11-28 DIAGNOSIS — F0150 Vascular dementia without behavioral disturbance: Secondary | ICD-10-CM | POA: Diagnosis present

## 2019-11-28 DIAGNOSIS — A498 Other bacterial infections of unspecified site: Secondary | ICD-10-CM

## 2019-11-28 DIAGNOSIS — A4152 Sepsis due to Pseudomonas: Secondary | ICD-10-CM

## 2019-11-28 DIAGNOSIS — E039 Hypothyroidism, unspecified: Secondary | ICD-10-CM | POA: Diagnosis present

## 2019-11-28 DIAGNOSIS — F0151 Vascular dementia with behavioral disturbance: Secondary | ICD-10-CM

## 2019-11-28 DIAGNOSIS — Z7189 Other specified counseling: Secondary | ICD-10-CM

## 2019-11-28 DIAGNOSIS — Z66 Do not resuscitate: Secondary | ICD-10-CM | POA: Diagnosis present

## 2019-11-28 DIAGNOSIS — Z515 Encounter for palliative care: Secondary | ICD-10-CM

## 2019-11-28 LAB — CBC
HCT: 37.9 % — ABNORMAL LOW (ref 39.0–52.0)
Hemoglobin: 12.9 g/dL — ABNORMAL LOW (ref 13.0–17.0)
MCH: 32.1 pg (ref 26.0–34.0)
MCHC: 34 g/dL (ref 30.0–36.0)
MCV: 94.3 fL (ref 80.0–100.0)
Platelets: 153 10*3/uL (ref 150–400)
RBC: 4.02 MIL/uL — ABNORMAL LOW (ref 4.22–5.81)
RDW: 13.7 % (ref 11.5–15.5)
WBC: 23.7 10*3/uL — ABNORMAL HIGH (ref 4.0–10.5)
nRBC: 0 % (ref 0.0–0.2)

## 2019-11-28 LAB — COMPREHENSIVE METABOLIC PANEL
ALT: 18 U/L (ref 0–44)
AST: 33 U/L (ref 15–41)
Albumin: 2.7 g/dL — ABNORMAL LOW (ref 3.5–5.0)
Alkaline Phosphatase: 47 U/L (ref 38–126)
Anion gap: 10 (ref 5–15)
BUN: 28 mg/dL — ABNORMAL HIGH (ref 8–23)
CO2: 24 mmol/L (ref 22–32)
Calcium: 8.8 mg/dL — ABNORMAL LOW (ref 8.9–10.3)
Chloride: 102 mmol/L (ref 98–111)
Creatinine, Ser: 0.91 mg/dL (ref 0.61–1.24)
GFR calc Af Amer: >60 mL/min (ref >60)
GFR calc non Af Amer: >60 mL/min (ref >60)
Glucose, Bld: 138 mg/dL — ABNORMAL HIGH (ref 70–99)
Potassium: 3.7 mmol/L (ref 3.5–5.1)
Sodium: 136 mmol/L (ref 135–145)
Total Bilirubin: 1.7 mg/dL — ABNORMAL HIGH (ref 0.3–1.2)
Total Protein: 4.8 g/dL — ABNORMAL LOW (ref 6.5–8.1)

## 2019-11-28 LAB — MAGNESIUM: Magnesium: 1.8 mg/dL (ref 1.7–2.4)

## 2019-11-28 LAB — TSH: TSH: 1.1 u[IU]/mL (ref 0.350–4.500)

## 2019-11-28 MED ORDER — SODIUM CHLORIDE 0.9 % IV SOLN
1.0000 g | INTRAVENOUS | Status: DC
  Filled 2019-11-28: qty 10

## 2019-11-28 MED ORDER — SODIUM CHLORIDE 0.9 % IV SOLN
2.0000 g | Freq: Two times a day (BID) | INTRAVENOUS | Status: DC
  Administered 2019-11-28 – 2019-11-29 (×3): 2 g via INTRAVENOUS
  Filled 2019-11-28 (×3): qty 2

## 2019-11-28 MED ORDER — MAGNESIUM SULFATE 2 GM/50ML IV SOLN
2.0000 g | Freq: Once | INTRAVENOUS | Status: AC
  Administered 2019-11-28: 2 g via INTRAVENOUS
  Filled 2019-11-28: qty 50

## 2019-11-28 NOTE — Progress Notes (Signed)
Initial Nutrition Assessment  DOCUMENTATION CODES:   Not applicable  INTERVENTION:  Reviewed potential meal options with Patient Microbiologist and ordered appropriate items  Double portions   NUTRITION DIAGNOSIS:   Inadequate oral intake related to other (see comment) (limited food acceptance) as evidenced by per patient/family report.    GOAL:   Patient will meet greater than or equal to 90% of their needs    MONITOR:   PO intake, Weight trends, Labs, I & O's  REASON FOR ASSESSMENT:   Consult Other (Comment) (pt with many food allergies)  ASSESSMENT:   Pt with a PMH including dementia, hypothyroidism, delusional disorder, and Mnire's disease presented with AMS.   RD consulted because pt has copious food allergies. Pt is noted to be very hard of hearing, so discussed pt's diet with his daughter. Per pt's daughter, he eats oatmeal made with water for breakfast, and plain meat/vegetables/starches for lunch and dinner. Discussed potential of using oral nutrition supplements to help pt meet nutrition needs, pt's daughter refused. RD will discuss pt's meal preferences/options with Patient Microbiologist and will order double portions.   No weight history on file.   PO Intake: 90% x1 recorded meal  Labs and medications reviewed.   NUTRITION - FOCUSED PHYSICAL EXAM:  Unable to perform at this time, will attempt at follow-up.   Diet Order:   Diet Order            Diet regular Room service appropriate? Yes; Fluid consistency: Thin  Diet effective now                 EDUCATION NEEDS:   Not appropriate for education at this time  Skin:  Skin Assessment: Reviewed RN Assessment  Last BM:  unknown  Height:   Ht Readings from Last 1 Encounters:  11/27/19 5\' 9"  (1.753 m)    Weight:   Wt Readings from Last 1 Encounters:  11/27/19 59.9 kg    BMI:  Body mass index is 19.49 kg/m.  Estimated Nutritional Needs:   Kcal:  1800-2000  Protein:   90-105 grams  Fluid:  >1.8L/d    11/29/19, MS, RD, LDN RD pager number and weekend/on-call pager number located in [facility].

## 2019-11-28 NOTE — Progress Notes (Signed)
Pharmacy Antibiotic Note  Paul Hopkins is a 86 y.o. male admitted on 11/27/2019 with UTI. Presented with an elevated WBC of 23.7, lactate of 2.0, and Tmax 100.4. Pharmacy has been consulted for Cefepime dosing.  Plan: Discontinue Ceftriaxone  Start Cefepime 2 gm IV q12hr Follow c&s, clinical improvement and renal function   Height: 5\' 9"  (175.3 cm) Weight: 59.9 kg (132 lb) IBW/kg (Calculated) : 70.7  Temp (24hrs), Avg:98.9 F (37.2 C), Min:97.7 F (36.5 C), Max:100.4 F (38 C)  Recent Labs  Lab 11/25/19 0818 11/27/19 1540 11/27/19 1754 11/28/19 0457  WBC 4.6 23.1*  --  23.7*  CREATININE 1.21 1.30*  --  0.91  LATICACIDVEN  --  2.7* 2.0*  --     Estimated Creatinine Clearance: 51.2 mL/min (by C-G formula based on SCr of 0.91 mg/dL).    Allergies  Allergen Reactions  . Amoxicillin Other (See Comments)    Reaction??  . Banana Other (See Comments)    Hip pain  . Bean Pod Extract Diarrhea and Other (See Comments)    Explosive diarrhea  . Blackberry [Rubus Fruticosus] Other (See Comments)    Flares the patient's Mnire's disease  . Carrot Oil Diarrhea and Other (See Comments)    Explosive diarrhea  . Ciprofloxacin Other (See Comments)    Reaction??  . Citric Acid Diarrhea and Other (See Comments)    Explosive diarrhea  . Citrus Diarrhea and Other (See Comments)    Explosive diarrhea  . Corn-Containing Products Other (See Comments)    Explosive diarrhea  . Egg [Eggs Or Egg-Derived Products] Diarrhea and Other (See Comments)    Explosive diarrhea  . Milk-Related Compounds Diarrhea and Other (See Comments)    Explosive diarrhea  . Other Diarrhea and Other (See Comments)    NO BERRIES WITH TINY SEEDS: Flares the patient's Mnire's disease  Bread- Explosive diarrhea  . Peanut-Containing Drug Products Diarrhea and Other (See Comments)    NO LEGUMES!! NO PEAS!! Explosive diarrhea  . Raspberry Other (See Comments)    Flares the patient's Mnire's disease  .  Strawberry Extract Other (See Comments)    Flares the patient's Mnire's disease  . Sulfa Antibiotics Other (See Comments)    Reaction??  . Trimethoprim Other (See Comments)    Reaction??  . Whey Diarrhea and Other (See Comments)    Explosive diarrhea    Antimicrobials this admission: 6/17 Ceftriaxone >> 6/17 6/17 Cefepime >>   Dose adjustments this admission: N/a  Microbiology results: 6/16 BCx: ngtd 6/17 UCx: 100K Pseudomonas aeruginosa   Thank you for allowing pharmacy to be a part of this patient's care.  7/17, PharmD  PGY1 Acute Care Pharmacy Resident 11/28/2019 2:46 PM

## 2019-11-28 NOTE — Progress Notes (Signed)
Physical Therapy Treatment Patient Details Name: Paul Hopkins MRN: [id number] DOB: [birth year] Today's Date: 11/28/2019    History of Present Illness Pt is an 86 yo male presenting with AMS. PMH includes: dementia, hypothyroidism, CVA (2017), vascular dementia with delusional disorder, and Mnire's disease.    PT Comments    The pt was in bed upon arrival of PT, agreeable to session with focus on progression of mobility and endurance. The pt was able to significantly progress ambulation distance in hallway and reduce assist needed to complete sit-stand transfers (minA to power up -> minG), but continues to benefit from minG during ambulation for safety. The pt will continue to benefit from skilled PT to maximize functional strengthening and endurance training while in the hospital, and will also benefit from mobility from RN staff as possible to further reduce risk of loss of strength and independence during his hospital stay. PT will continue to follow acutely in hopes of allowing the pt to return to independent living.     Follow Up Recommendations  Home health PT;Supervision/Assistance - 24 hour     Equipment Recommendations  None recommended by PT    Recommendations for Other Services       Precautions / Restrictions Precautions Precautions: Fall Precaution Comments: pt [place] Restrictions Weight Bearing Restrictions: No    Mobility  Bed Mobility Overal bed mobility: Needs Assistance Bed Mobility: Supine to Sit     Supine to sit: Min guard     General bed mobility comments: minG for safety, VC to complete mobility, but no assist needed.  Transfers Overall transfer level: Needs assistance Equipment used: Rolling walker (2 wheeled) Transfers: Sit to/from Stand Sit to Stand: Min assist;Min guard         General transfer comment: minA to power up, significant posterior lean initially. improved within session to minG with VC for hand placement to power up from  recliner, able to improve eccentric lower with loud VC each rep. x6 through session  Ambulation/Gait Ambulation/Gait assistance: Min guard Gait Distance (Feet): 50 Feet (+ 50 ft) Assistive device: Rolling walker (2 wheeled) Gait Pattern/deviations: Step-through pattern;Decreased stride length;Shuffle Gait velocity: decreased   General Gait Details: Pt with slow, short steps with minimal clearance, pt needing only minA with RW initially, progressed to minG within session     Balance Overall balance assessment: Needs assistance Sitting-balance support: Bilateral upper extremity supported;Feet supported Sitting balance-Leahy Scale: Fair Sitting balance - Comments: pt able to maintain sitting EOB without LOB   Standing balance support: Bilateral upper extremity supported;During functional activity Standing balance-Leahy Scale: Poor Standing balance comment: pt reliant on BUE support and external support, sig posterior lean initially         Cognition Arousal/Alertness: Awake/alert Behavior During Therapy: WFL for tasks assessed/performed Overall Cognitive Status: Impaired/Different from baseline Area of Impairment: Problem solving;Memory        Memory: Decreased short-term memory       Problem Solving: Requires verbal cues;Difficulty sequencing General Comments: pt continues to benefit from [facility] for sequencing, limited STM noted within session         General Comments General comments (skin integrity, edema, etc.): VSS throughout on RA      Pertinent Vitals/Pain Pain Assessment: No/denies pain           PT Goals (current goals can now be found in the care plan section) Acute Rehab PT Goals Patient Stated Goal: return to [facility] PT Goal Formulation: With patient Time For Goal Achievement: 12/12/19 Potential to Achieve  Goals: Good Progress towards PT goals: Progressing toward goals    Frequency    Min 3X/week      PT Plan Current plan remains appropriate        AM-PAC PT "6 Clicks" Mobility   Outcome Measure  Help needed turning from your back to your side while in a flat bed without using bedrails?: A Little Help needed moving from lying on your back to sitting on the side of a flat bed without using bedrails?: A Little Help needed moving to and from a bed to a chair (including a wheelchair)?: A Little Help needed standing up from a chair using your arms (e.g., wheelchair or bedside chair)?: A Little Help needed to walk in hospital room?: A Little Help needed climbing 3-5 steps with a railing? : A Lot 6 Click Score: 17    End of Session Equipment Utilized During Treatment: Gait belt Activity Tolerance: Patient tolerated treatment well Patient left: in chair;with call bell/phone within reach;with nursing/sitter in room Nurse Communication: Mobility status (need for condom cath) PT Visit Diagnosis: Unsteadiness on feet (R26.81);Muscle weakness (generalized) (M62.81)     Time: 9675-9163 PT Time Calculation (min) (ACUTE ONLY): 23 min  Charges:  $Gait Training: 23-37 mins                     Rolm Baptise, PT, DPT   Acute Rehabilitation Department Pager #: [phone]   Gaetana Michaelis 11/28/2019, 5:20 PM

## 2019-11-28 NOTE — Evaluation (Signed)
Physical Therapy Evaluation Patient Details Name: Paul Hopkins MRN: [id number] DOB: 07/01/1933 Today's Date: 11/28/2019   History of Present Illness  Pt is an 86 yo male presenting with AMS. PMH includes: dementia, hypothyroidism, CVA (2017), vascular dementia with delusional disorder, and Mnire's disease.  Clinical Impression  Pt in bed upon arrival of PT, agreeable to evaluation at this time. Prior to admission the pt was living alone at [facility] independent living facility, mobilizing with RW, and receiving intermittent safety checks by facility staff and daughter, but otherwise independent with self-care. The pt now presents with limitations in functional mobility, endurance, strength, and stability due to above dx, and will continue to benefit from skilled PT to address these deficits. The pt was able to complete short ambulation in his room with minA for stability with use of RW, but continued to demo balance deficits both in seated and standing positions with multiple posterior LOB in sitting and significant posterior lean initially in standing requiring minA and VC to correct. After talking with the pt's daughter, she is most concerned with the pt's ability to maintain strength and endurance to maintain independence, as we both feel a familiar environment will be best for this patient. The pt will continue to benefit from skilled PT acutely and following d/c to further maximize strength, endurance, and stability to allow for safe return to independence following d/c.      Follow Up Recommendations Home health PT;Supervision/Assistance - 24 hour (per daughter, the pt can receive up to 8 safety checks each day by staff, daughter may be able to stay for a few days if necessary)    Equipment Recommendations  None recommended by PT    Recommendations for Other Services       Precautions / Restrictions Precautions Precautions: Fall Precaution Comments: pt veryHOH      Mobility   Bed Mobility Overal bed mobility: Needs Assistance Bed Mobility: Supine to Sit     Supine to sit: Min guard     General bed mobility comments: minG for safety, VC to complete mobility, but no assist needed.  Transfers Overall transfer level: Needs assistance Equipment used: Rolling walker (2 wheeled) Transfers: Sit to/from Stand Sit to Stand: Min assist         General transfer comment: minA to power up, significant posterior lean initially. able to improve standing posture and postioning with VC and time. minA to lower with significant VC for controlled lower  Ambulation/Gait Ambulation/Gait assistance: Min assist Gait Distance (Feet): 5 Feet Assistive device: Rolling walker (2 wheeled) Gait Pattern/deviations: Step-to pattern;Decreased stride length;Narrow base of support Gait velocity: decreased Gait velocity interpretation: <1.31 ft/sec, indicative of household ambulator General Gait Details: Pt with slow, short steps with minimal clearance and increased attention to each step, pt needing only minA with RW.  Stairs            Wheelchair Mobility    Modified Rankin (Stroke Patients Only)       Balance Overall balance assessment: Needs assistance Sitting-balance support: Bilateral upper extremity supported;Feet supported Sitting balance-Leahy Scale: Poor Sitting balance - Comments: pt frequently losing balance posteriorly and to L, able to correct when gien cues. Postural control: Posterior lean;Left lateral lean Standing balance support: Bilateral upper extremity supported;During functional activity Standing balance-Leahy Scale: Poor Standing balance comment: pt reliant on BUE support and external support, sig posterior lean initially  Pertinent Vitals/Pain Pain Assessment: No/denies pain    Home Living Family/patient expects to be discharged to:: Other (Comment)                 Additional Comments:  [facility]    Prior Function Level of Independence: Needs assistance   Gait / Transfers Assistance Needed: Pt initially reporting no use of AD, later in session reports he uses RW  ADL's / Homemaking Assistance Needed: Pt reports no assist needed for ADLs such as bathing or dressing  Comments: Pt is unreliable historian     Hand Dominance        Extremity/Trunk Assessment   Upper Extremity Assessment Upper Extremity Assessment: Generalized weakness    Lower Extremity Assessment Lower Extremity Assessment: Generalized weakness    Cervical / Trunk Assessment Cervical / Trunk Assessment: Kyphotic  Communication   Communication: HOH  Cognition Arousal/Alertness: Awake/alert Behavior During Therapy: WFL for tasks assessed/performed Overall Cognitive Status: Impaired/Different from baseline Area of Impairment: Orientation;Problem solving;Memory                 Orientation Level: Disoriented to;Place   Memory: Decreased short-term memory       Problem Solving: Requires verbal cues;Difficulty sequencing General Comments: pt benefits from [facility] for sequencing with mobility, able to follow commands and answer orientation questions x3 when he can hear the question. Limited STM noted within session      General Comments General comments (skin integrity, edema, etc.): VSS throughout on RA, very HOH, pt did not indicate one ear better than the other    Exercises     Assessment/Plan    PT Assessment Patient needs continued PT services  PT Problem List Decreased strength;Decreased mobility;Decreased safety awareness;Decreased range of motion;Decreased knowledge of precautions;Decreased activity tolerance;Decreased cognition;Decreased balance;Decreased knowledge of use of DME       PT Treatment Interventions DME instruction;Therapeutic exercise;Gait training;Stair training;Functional mobility training;Therapeutic activities;Patient/family education;Cognitive  remediation;Balance training    PT Goals (Current goals can be found in the Care Plan section)  Acute Rehab PT Goals Patient Stated Goal: return to [facility] PT Goal Formulation: With patient Time For Goal Achievement: 12/12/19 Potential to Achieve Goals: Good    Frequency Min 3X/week   Barriers to discharge   pt will benefit from 24/7 supervision for safety following d/c    Co-evaluation               AM-PAC PT "6 Clicks" Mobility  Outcome Measure Help needed turning from your back to your side while in a flat bed without using bedrails?: A Little Help needed moving from lying on your back to sitting on the side of a flat bed without using bedrails?: A Little Help needed moving to and from a bed to a chair (including a wheelchair)?: A Little Help needed standing up from a chair using your arms (e.g., wheelchair or bedside chair)?: A Little Help needed to walk in hospital room?: A Lot Help needed climbing 3-5 steps with a railing? : A Lot 6 Click Score: 16    End of Session Equipment Utilized During Treatment: Gait belt Activity Tolerance: Patient tolerated treatment well Patient left: in chair;with chair alarm set;with call bell/phone within reach Nurse Communication: Mobility status (call bell attached, missing cord to alert RN station) PT Visit Diagnosis: Unsteadiness on feet (R26.81);Muscle weakness (generalized) (M62.81)    Time: 2620-3559 PT Time Calculation (min) (ACUTE ONLY): 27 min   Charges:   PT Evaluation $PT Eval Moderate Complexity: 1 Mod PT Treatments $Gait  Training: 8-22 mins        Karma Ganja, PT, DPT   Acute Rehabilitation Department Pager #: [phone]  Paul Hopkins 11/28/2019, 10:55 AM

## 2019-11-28 NOTE — Progress Notes (Signed)
Occupational Therapy Evaluation Patient Details Name: Paul Hopkins MRN: [id number] DOB: 02/16/1934 Today's Date: 11/28/2019    History of Present Illness Pt is an 86 yo male presenting with AMS. PMH includes: dementia, hypothyroidism, CVA (2017), vascular dementia with delusional disorder, and Mnire's disease.   Clinical Impression   Pt's daughter at bedside, answering most questions regarding PLOF and home environment setup secondary to pt HOH. Prior to hospitalization, pt was living in [employer] [facility]) alone. Pt utilized RW for functional mobility and often refused CNA help with ADLs. Pt admitted for above and limited by decreased memory, decreased safety awareness, decreased balance, and decreased activity tolerance. Today, pt received sitting up in recliner with daughter at bedside. Pt required min guard for functional transfers using RW for support. Mod v/c's/tactile cues given for RW management. No LOB noted. Pt's daughter reports that he typically has poor balance. Pt required mod-max assist for LB dressing due to decreased flexibility. Pt setup for grooming sitting in recliner. Pt would benefit from continued skilled acute care OT services to address ADLs/IADLs and functional mobility/transfers. Pt's daughter prefers that pt returns to [facility] with HHOT and 24/7 supervision since he was previously receiving HHOT and HHPT. With 24/7 supervision I agree that [facility] could be a safe option. Will continue to follow pt acutely as able.    Follow Up Recommendations  Home health OT;Supervision/Assistance - 24 hour;Other (comment) (Daughter prefers HHOT & confirms she can provide extra supervision if needed)    Equipment Recommendations  None recommended by OT    Recommendations for Other Services       Precautions / Restrictions Precautions Precautions: Fall Precaution Comments: pt very HOH Restrictions Weight Bearing Restrictions: No      Mobility Bed  Mobility Overal bed mobility:  (received up in recliner) Bed Mobility: Supine to Sit     Supine to sit: Min guard     General bed mobility comments:  (received up in recliner)  Transfers Overall transfer level: Needs assistance Equipment used: Rolling walker (2 wheeled) Transfers: Sit to/from Stand Sit to Stand: Min guard         General transfer comment: pt stood from recliner with min guard and extended time using RW for support and v/c's for hand placement    Balance Overall balance assessment: Needs assistance Sitting-balance support: No upper extremity supported;Feet supported Sitting balance-Leahy Scale: Good Sitting balance - Comments:  (sitting at edge of recliner)   Standing balance support: Bilateral upper extremity supported;During functional activity Standing balance-Leahy Scale: Poor Standing balance comment: pt reliant on BUE support and external support                           ADL either performed or assessed with clinical judgement   ADL Overall ADL's : Needs assistance/impaired Eating/Feeding: Independent;Sitting Eating/Feeding Details (indicate cue type and reason): pt finishing dinner upon arrival Grooming: Set up;Sitting   Upper Body Bathing: Set up;Sitting   Lower Body Bathing: Moderate assistance;Sitting/lateral leans;Sit to/from stand   Upper Body Dressing : Set up;Sitting   Lower Body Dressing: Moderate assistance;Sitting/lateral leans;Sit to/from stand Lower Body Dressing Details (indicate cue type and reason): when asked to don/doff bilateral socks pt reported that it was a hard reach; pt able to touch bilateral feet while sitting in recliner Toilet Transfer: Min guard;Cueing for safety;Cueing for sequencing;Ambulation;Comfort height toilet;Grab bars;RW Statistician Details (indicate cue type and reason): pt transferred on/off raised commode with min guard and v/c's  for sequencing hand placement and line management Toileting-  Clothing Manipulation and Hygiene: Moderate assistance;Sitting/lateral lean;Sit to/from stand;Cueing for safety;Cueing for sequencing       Functional mobility during ADLs: Min guard;Cueing for safety;Cueing for sequencing;Rolling walker General ADL Comments: pt requires min guard for functional mobility to/from bathroom for toilet transfer using RW for support; no LOB noted; mod verbal cues for RW management     Vision Baseline Vision/History: Wears glasses Wears Glasses: At all times Patient Visual Report: No change from baseline       Perception     Praxis      Pertinent Vitals/Pain Pain Assessment: No/denies pain     Hand Dominance Right   Extremity/Trunk Assessment Upper Extremity Assessment Upper Extremity Assessment: Overall WFL for tasks assessed   Lower Extremity Assessment Lower Extremity Assessment: Defer to PT evaluation   Cervical / Trunk Assessment Cervical / Trunk Assessment: Kyphotic   Communication Communication Communication: HOH   Cognition Arousal/Alertness: Awake/alert Behavior During Therapy: Flat affect;WFL for tasks assessed/performed Overall Cognitive Status: Impaired/Different from baseline Area of Impairment: Orientation;Memory;Following commands;Problem solving                 Orientation Level: Disoriented to;Place   Memory: Decreased short-term memory Following Commands: Follows one step commands consistently     Problem Solving: Requires verbal cues;Difficulty sequencing General Comments: pt continues to benefit from [facility] for sequencing, limited STM noted within session   General Comments  vitals stable; daughter present answering questions; skin intact    Exercises     Shoulder Instructions      Home Living Family/patient expects to be discharged to:: Other (Comment)                                 Additional Comments: [facility]      Prior Functioning/Environment Level of Independence: Needs  assistance  Gait / Transfers Assistance Needed: pt's daughter reports that pt utilizes RW within living environment and has difficulty with balance ADL's / Homemaking Assistance Needed: daughter reports that CNAs at independent living facility attempt to assist him with ADLs however he usually refused assistance   Comments: Pt is unreliable historian; daughter present to answer questions about PLOF and home setup        OT Problem List: Decreased range of motion;Decreased activity tolerance;Impaired balance (sitting and/or standing);Decreased cognition;Decreased coordination;Decreased safety awareness;Decreased knowledge of use of DME or AE      OT Treatment/Interventions: Self-care/ADL training;Therapeutic exercise;Energy conservation;DME and/or AE instruction;Therapeutic activities;Patient/family education    OT Goals(Current goals can be found in the care plan section) Acute Rehab OT Goals Patient Stated Goal: return to [facility] OT Goal Formulation: With patient/family  OT Frequency: Min 3X/week   Barriers to D/C:            Co-evaluation              AM-PAC OT "6 Clicks" Daily Activity     Outcome Measure Help from another person eating meals?: None Help from another person taking care of personal grooming?: None Help from another person toileting, which includes using toliet, bedpan, or urinal?: A Lot Help from another person bathing (including washing, rinsing, drying)?: A Lot Help from another person to put on and taking off regular upper body clothing?: None Help from another person to put on and taking off regular lower body clothing?: A Lot 6 Click Score: 18   End of Session Equipment Utilized  During Treatment: Gait belt;Rolling walker Nurse Communication: Mobility status  Activity Tolerance: Patient tolerated treatment well Patient left: in chair;with chair alarm set;with family/visitor present  OT Visit Diagnosis: Unsteadiness on feet (R26.81);Muscle  weakness (generalized) (M62.81)                Time: 6503-5465 OT Time Calculation (min): 35 min Charges:  OT General Charges $OT Visit: 1 Visit OT Evaluation $OT Eval Moderate Complexity: 1 Mod OT Treatments $Self Care/Home Management : 8-22 mins  Michel Bickers, OTR/L [facility] [phone]  Francesca Jewett 11/28/2019, 7:59 PM

## 2019-11-28 NOTE — TOC Initial Note (Signed)
Transition of Care [facility]) - Initial/Assessment Note    Patient Details  Name: Paul Hopkins MRN: [id number] Date of Birth: 04-19-1934  Transition of Care Paul Hopkins) CM/SW Contact:    Bartholomew Crews, RN Phone Number: [phone] 11/28/2019, 4:05 PM  Clinical Narrative:                  Spoke with patient at bedside. Patient very [place] but was able to provide CM permission to speak with his daughter, Paul Hopkins. PTA patient has been living at [facility] where he receives home health services from [facility] for PT and OT. Patient will need home health orders for PT and OT to resume therapy services at discharge. Patient also receives services from [facility] for safety checks and medication assistance. Paul Hopkins has also made arrangements for patient to get an escort to meals and assistance for breakfast. Safety checks can also be enhanced to include 24/7 checks. Patient also has a RW and hearing aids at home. Patient also uses the [facility] for PCP, and has an appointment scheduled next Thursday with the provider Paul Hopkins. Paul Hopkins anticipates that she will be able to transport him back to [facility] in private vehicle. TOC following for transition needs.   Expected Discharge Plan: Paul Hopkins to Discharge: Continued Medical Work up   Patient Goals and CMS Choice Patient states their goals for this hospitalization and ongoing recovery are:: return home to Paul Hopkins Compare Post Acute Care list provided to:: Patient Choice offered to / list presented to : Adult Children  Expected Discharge Plan and Services Expected Discharge Plan: [place] In-house Referral: NA Discharge Planning Services: CM Consult Post Acute Care Choice: [place] arrangements for the past 2 months: [place] ([facility])                 DME Arranged: N/A DME Agency: NA       HH Arranged: PT, OT [place]  Agency: Other - See comment Secondary school teacher) Date HH Agency Contacted: 11/28/19 Time HH Agency Contacted: 42 Representative spoke with at [place]: Rollene Fare  Prior Living Arrangements/Services 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) Lives with:: Self Patient language and need for interpreter reviewed:: Yes Do you feel safe going back to the place where you live?: Yes      Need for Family Participation in Patient Care: Yes (Comment) Care giver support system in place?: Yes (comment) Current home services: DME, Other (comment), Home PT, Home OT ([facility] for safety checks and medication) Criminal Activity/Legal Involvement Pertinent to Current Situation/Hospitalization: No - Comment as needed  Activities of Daily Living Home Assistive Devices/Equipment: Environmental consultant (specify type) ADL Screening (condition at time of admission) Patient's cognitive ability adequate to safely complete daily activities?: No Is the patient deaf or have difficulty hearing?: Yes Does the patient have difficulty seeing, even when wearing glasses/contacts?: Yes Does the patient have difficulty concentrating, remembering, or making decisions?: Yes Patient able to express need for assistance with ADLs?: Yes Does the patient have difficulty dressing or bathing?: Yes Independently performs ADLs?: Yes (appropriate for developmental age) Does the patient have difficulty walking or climbing stairs?: Yes Weakness of Legs: Both Weakness of Arms/Hands: Both  Permission Sought/Granted Permission sought to share information with : Family Supports Permission granted to share information with : Yes, Verbal Permission Granted  Share Information with NAME: Paul Hopkins     Permission granted to  share info w Relationship: daughter  Permission granted to share info w Contact Information: [phone]  Emotional Assessment Appearance:: Appears stated age Attitude/Demeanor/Rapport:  Engaged Affect (typically observed): Accepting Orientation: : Fluctuating Orientation (Suspected and/or reported Sundowners) Alcohol / Substance Use: Not Applicable Psych Involvement: No (comment)  Admission diagnosis:  Edema [R60.9] Acute cystitis without hematuria [N30.00] Sepsis (HCC) [A41.9] Sepsis, due to unspecified organism, unspecified whether acute organ dysfunction present [facility]) [A41.9] Patient Active Problem List   Diagnosis Date Noted  . Pseudomonas infection 11/28/2019  . Vascular dementia (HCC) 11/28/2019  . Hypothyroidism (acquired) 11/28/2019  . Palliative care by specialist   . Goals of care, counseling/discussion   . DNR (do not resuscitate)   . Sepsis (HCC) 11/27/2019   PCP:  Patient, No Pcp Per Pharmacy:   [facility] - [place], [place] - [address] [place] [address] Phone: [phone] Fax: [phone]     Social Determinants of Health (SDOH) Interventions    Readmission Risk Interventions No flowsheet data found.

## 2019-11-28 NOTE — Progress Notes (Addendum)
FMTS Attending Daily Note: Paul Starr, MD  Team Pager [phone] Pager [phone]  I have seen and examined this patient, reviewed their chart. I have discussed this patient with the resident. I agree with the resident's findings, assessment and care plan.   Family Medicine Teaching Service Daily Progress Note Intern Pager: [phone]  Patient name: Paul Hopkins Medical record number: [id number] Date of birth: 06-Oct-1933 Age: 86 y.o. Gender: male  Primary Care Provider: Patient, No Pcp Per Consultants: None Code Status: DNR  Pt Overview and Major Events to Date:  6/16-admitted 6/17- Pseudmonas in urine culture, started cefepime   Assessment and Plan: Paul Hopkins is a 86 y.o. male presenting with sepsis of urinary origin. PMH is significant for dementia, hypothyroidism, delusional disorder, Mnire's disease.  Sepsis due to Psuedomonas UTI  Patient presented to the ED from SNF with altered mental status.  Patient's daughter reports that he had been complaining of needing to pee but being unable to do so.  Febrile to 100.4.  Patient was recently seen in the emergency department and had work-up for altered mental status which there were no acute findings, thought to be related to patient's dementia.  Per patient's family he does seem more lethargic than baseline.  A urine was collected which was consistent with a UTI showing large leukocytes, negative nitrite, many bacteria and a cloudy appearance.  A lactic acid and CBC were also collected and were significant for a lactic acid of 2.7 which was trended to 2.0 after 1.5L NS, and WBC 23.  CXR was negative for any findings of acute infection.  CT abdomen and pelvis showed mild periportal edema and trace pericholecystic fluid but was otherwise normal.  CMP showed normal LFTs and mildly elevated bilirubin at 1.9.  No visible gallstones on the CT scan and pancreas was also unremarkable.  Right upper quadrant ultrasound showed small volume ascites  in the right upper quadrant including trace pericholecystic fluid.  No gallstones, biliary dilation, or focal liver abnormalities identified.  -Vitals per routine - s/p CTX (6/16), continuing daily pending cultures- transitioned to Cefepime  - f/u urine cx - f/u blood cx -Consider reevaluating antibiotics broadening if status worsens -Morning CBC -Morning CMP -PT/OT eval and treat -Continuous pulse ox -Fall precautions -Regular diet (see dietary restrictions note from patient's daughter) -We will repeat EKG -We will check magnesium level  Code Status See HPI for further details regarding discussion with Robynn Pane and Luther Parody.  They both agree that Luther Parody is Museum/gallery exhibitions officer.  Luther Parody and [place] both agree that patient would want chest compressions but would not want to be intubated.  They both also state that this is not explicitly stated in his living will.  This information does not seem to be on file with [facility] per discussion with nurse Morrie Sheldon there this evening.  Given this confusion and no clear documentation, will consult palliative care in the morning to ensure that CODE STATUS is appropriately documented for the family in the future.  Given family's wishes for patient's CODE STATUS, patient will be made a DNI.  Both Luther Parody and Robynn Pane are aware that palliative care may contact them in the morning in regards to CODE STATUS. - consult palliative for code status -Per palliative's discussions with family patient is now DNR.  Hypothyroidis Patient takes levothyroxine at 88 mcg daily at home.  TSH within normal limits at 1.100. -Continue home levothyroxine  Vascular dementia  delusional disorder Patient is oriented to person, place, time.  Per patient's daughter his vascular  dementia has affected his personality more than anything else.  He also has a diagnosis of delusional disorder per patient's daughter.  He was concerned that "hate groups were after him".  Home medications include  Abilify 10 mg daily. -Continue home Abilify  History of CVA Patient's daughter reports history of CVA in 2017.  He has been resistant to any medication treatments.  He is stable at this time with no focal neurologic deficits. -Follow-up outpatient  History of Mnire's disease Stable at this time, patient with hearing deficit. -No inpatient management necessary  FEN/GI: Regular diet, per patient's daughter patient has food allergies to dairy, corn, carrots, citrus fruits, bananas, berries such as strawberries, raspberries, blackberries (patient apparently can eat blueberries without issue), and legumes.  PPx: Lovenox  Disposition: Likely 24-48 additional hours, possibly more pending results of cultures.  Subjective:  Patient denies shortness of breath, chest pain, abdominal pain including suprapubic discomfort. He denies dysuria. When asked alert and oriented questions patient is alert to person, year, president. Patient states that he is in the hospital but was not sure which one.  Spoke with patient's daughter at bedside and provided her with updates and answered her questions.  She states that she is really hopeful that his hospital stay will be as short as possible and that she feels he seems to be near his baseline at this time.  Objective: Temp:  [97.7 F (36.5 C)-100.4 F (38 C)] 97.7 F (36.5 C) (06/17 0507) Pulse Rate:  [37-82] 67 (06/17 0507) Resp:  [13-25] 18 (06/17 0507) BP: (104-115)/(48-55) 104/50 (06/17 0507) SpO2:  [95 %-100 %] 95 % (06/17 0507) Weight:  [59.9 kg] 59.9 kg (06/16 2213) Physical Exam: General: Alert and oriented to person, year, president. Patient states he is in a hospital but was not sure which one. Heart: S1, S2 with no murmurs appreciated Lungs: CTA bilaterally, no wheezing Abdomen: Bowel sounds present, no abdominal pain, no suprapubic discomfort to palpation Skin: Warm and dry Extremities: Trace lower extremity edema  Laboratory: Recent  Labs  Lab 11/25/19 0818 11/27/19 1540 11/28/19 0457  WBC 4.6 23.1* 23.7*  HGB 14.5 14.0 12.9*  HCT 44.9 43.5 37.9*  PLT 196 189 153   Recent Labs  Lab 11/25/19 0818 11/27/19 1540 11/28/19 0457  NA 140 136 136  K 4.7 4.1 3.7  CL 103 99 102  CO2 30 26 24   BUN 32* 31* 28*  CREATININE 1.21 1.30* 0.91  CALCIUM 9.6 9.3 8.8*  PROT 6.2* 5.8* 4.8*  BILITOT 0.9 1.9* 1.7*  ALKPHOS 54 52 47  ALT 18 19 18   AST 27 38 33  GLUCOSE 117* 156* 138*     Imaging/Diagnostic Tests: CT ABDOMEN PELVIS WO CONTRAST  Result Date: 11/27/2019 CLINICAL DATA:  Nausea and vomiting, weakness, lethargy EXAM: CT ABDOMEN AND PELVIS WITHOUT CONTRAST TECHNIQUE: Multidetector CT imaging of the abdomen and pelvis was performed following the standard protocol without IV contrast. COMPARISON:  None. FINDINGS: Lower chest: No acute pleural or parenchymal lung disease. Hepatobiliary: There is diffuse periportal edema identified, nonspecific. Gallbladder is relatively decompressed no shadowing gallstones. Trace pericholecystic fluid is noted. Pancreas: Unremarkable. No pancreatic ductal dilatation or surrounding inflammatory changes. Spleen: Normal in size without focal abnormality. Adrenals/Urinary Tract: No urinary tract calculi or obstructive uropathy. Bladder is only minimally distended, with no gross abnormalities. No adrenal masses. Stomach/Bowel: No evidence of bowel obstruction or ileus. No evidence of bowel wall thickening. Evaluation is limited due to the lack of intraperitoneal fat coupled with the  lack of intravenous and oral contrast. Vascular/Lymphatic: Aortic atherosclerosis. No enlarged abdominal or pelvic lymph nodes. Reproductive: Marked enlargement of the prostate measuring 7.5 x 6.6 by 6.2 cm. Other: No significant free fluid. No free gas. No abdominal wall hernia. Musculoskeletal: No acute or destructive bony lesions. Reconstructed images demonstrate no additional findings. IMPRESSION: 1. Periportal edema  and trace pericholecystic fluid, nonspecific. This could be related to underlying liver disease or hepatitis. If further evaluation of the liver and gallbladder is desired, ultrasound could be performed. 2. Marked enlargement of the prostate. 3. Aortic Atherosclerosis (ICD10-I70.0). Electronically Signed   By: Randa Ngo M.D.   On: 11/27/2019 19:34   CT Head Wo Contrast  Result Date: 11/25/2019 CLINICAL DATA:  Confusion.  Cerebral hemorrhage suspected. EXAM: CT HEAD WITHOUT CONTRAST TECHNIQUE: Contiguous axial images were obtained from the base of the skull through the vertex without intravenous contrast. COMPARISON:  None. FINDINGS: Brain: Moderate low density in the periventricular white matter likely related to small vessel disease. Cerebral and cerebellar atrophy is age-appropriate. No mass lesion, hemorrhage, hydrocephalus, acute infarct, intra-axial, or extra-axial fluid collection. Vascular: Intracranial atherosclerosis. Dolichoectasia of the left vertebral artery. No hyperdense vessel or unexpected calcification. Skull: No significant soft tissue swelling.  No skull fracture. Sinuses/Orbits: Left lens extraction. Minimal mucosal thickening of maxillary sinuses and ethmoid air cells. Clear mastoid air cells. Other: None. IMPRESSION: 1. No acute intracranial abnormality. 2. Cerebral atrophy and small vessel ischemic change. 3. Minimal sinus disease. Electronically Signed   By: Abigail Miyamoto M.D.   On: 11/25/2019 09:15   DG Chest Port 1 View  Result Date: 11/27/2019 CLINICAL DATA:  Weakness, lethargy, decreased communication. EXAM: PORTABLE CHEST 1 VIEW COMPARISON:  Chest radiograph 11/25/2019 FINDINGS: Stable cardiomediastinal contours. The lungs are clear. No pneumothorax or significant pleural effusion. No acute finding in the visualized skeleton. IMPRESSION: No acute cardiopulmonary process. Electronically Signed   By: Audie Pinto M.D.   On: 11/27/2019 15:55   DG Chest Port 1  View  Result Date: 11/25/2019 CLINICAL DATA:  Fatigue, shortness of breath and confusion. EXAM: PORTABLE CHEST 1 VIEW COMPARISON:  None. FINDINGS: Lungs clear. Heart size normal. Aortic atherosclerosis. No pneumothorax or pleural fluid. No acute or focal bony abnormality. IMPRESSION: No acute disease. Aortic Atherosclerosis (ICD10-I70.0). Electronically Signed   By: Inge Rise M.D.   On: 11/25/2019 09:57   US Abdomen Limited RUQ  Result Date: 11/27/2019 CLINICAL DATA:  Periportal edema and pericholecystic fluid on CT. EXAM: ULTRASOUND ABDOMEN LIMITED RIGHT UPPER QUADRANT COMPARISON:  CT abdomen and pelvis 11/27/2019 FINDINGS: Gallbladder: No gallstones or wall thickening visualized. Trace pericholecystic fluid. No sonographic Murphy sign noted by sonographer. Common bile duct: Diameter: 6 mm Liver: No focal lesion identified. Within normal limits in parenchymal echogenicity. Portal vein is patent on color Doppler imaging with normal direction of blood flow towards the liver. Other: Small volume right upper quadrant ascites. IMPRESSION: 1. Small volume ascites in the right upper quadrant including trace pericholecystic fluid. 2. No gallstones, biliary dilatation, or focal liver abnormality identified. Electronically Signed   By: Logan Bores M.D.   On: 11/27/2019 20:30    Lurline Del, DO 11/28/2019, 9:36 AM PGY-1, [place] Intern pager: [phone], text pages welcome

## 2019-11-28 NOTE — Consult Note (Signed)
Palliative Medicine Inpatient Consult Note  Reason for consult:  Code Status  HPI:  Per intake H&P --> Paul Hopkins is a 86 y.o. male presenting with altered mental status. PMH is significant for vascular dementia, hypothyroidism, delusional disorder, Mnire's disease. Patient was admitted to [facility] for worsening lethargy and fever thought to be related to a urinary tract infection.   Palliative care was consulted to review code status.  Clinical Assessment/Goals of Care: I have reviewed medical records including EPIC notes, labs and imaging, received report from bedside RN, assessed the patient.    I called Paul Hopkins and Paul Hopkins to further discuss diagnosis prognosis, GOC, EOL wishes, disposition and options.   I introduced Palliative Medicine as specialized medical care for people living with serious illness. It focuses on providing relief from the symptoms and stress of a serious illness. The goal is to improve quality of life for both the patient and the family.  I asked Paul Hopkins to tell me about Emil as a person. Sohan grew up in [place]. He has been married for 60 five years and has one daughter. He has been separated from his wife for the past thirteen years due to his delusion disorder with ongoing paranoia. He graduated with a PhD and worked as an Chief Financial Officer for [employer]. He enjoys gardening, bird watching, and the [employer]. He was raised as Paul Hopkins though has not been to church in quite sometime.   In terms of [place] recent events he has two episodes of locking himself out of his home in October - November leading to hospitalization and skilled nursing placement. He improved and his daughter, Paul Hopkins sought placement at [facility] assisted living so that he may be closer to her. He had been quite functionally independent using a walker for mobility. He was able to fix his own meals though has complicated allergies so meals  are often double servings. Paul Hopkins feels that although he has vascular dementia he leads a fairly good quality of life. She shares that he has a notable hearing deficit.   A detailed discussion was had today regarding advanced directives, per Paul Hopkins the patient has a living will which asserts Paul Hopkins as the [facility] a copy of this has been requested.    Concepts specific to code status, artifical feeding and hydration, continued IV antibiotics and rehospitalization was had.  We reviewed what a code may look like in [name] case. We went over the trauma that could be endured and harm that was likely to follow. We discussed quality of life over quantity of life. We discussed the < 2% chance of survival in such instances and the impact a code may have on Paul Hopkins life. Paul Hopkins understood this and per review of [place] will he believes that Paul Hopkins would want to die naturally and comfortably. He does not feel that resuscitation could align with this. He agrees that the current clinical course os appropriate and feels that we should make Paul Hopkins DNR code status.   Discussed the importance of continued conversation with family and their  medical providers regarding overall plan of care and treatment options, ensuring decisions are within the context of the patients values and GOCs.  Decision Maker: Anil Havard ([place] [phone]; Cell [phone]  SUMMARY OF RECOMMENDATIONS   DNAR/DNI  Requested copy of living will  Provided HCPOA with an electronic version of "Hard Choices for Loving People" booklet  Code Status/Advance Care Planning: DNAR/DNI   Palliative Prophylaxis:  Mobilize, oral Care  Additional Recommendations (Limitations, Scope, Preferences):  Continue current scope pf treatment   Psycho-social/Spiritual:   Desire for further Chaplaincy support: No  Additional Recommendations: Education on CPR   Prognosis: Unclear  Discharge Planning: Will likely  require skilled nursing placement  Review of Systems  Unable to perform ROS: Dementia   Vitals with BMI 11/28/2019 11/28/2019 11/27/2019  Height - - 5\' 9"   Weight - - 132 lbs  BMI - - 19.48  Systolic 104 105  Diastolic 50 48 55  Pulse 67 76 72   Physical Exam Vitals reviewed.  Constitutional:      Appearance: He is ill-appearing.  HENT:     Head: Normocephalic.     Nose: Nose normal.     Mouth/Throat:     Mouth: Mucous membranes are dry.  Eyes:     Pupils: Pupils are equal, round, and reactive to light.  Cardiovascular:     Rate and Rhythm: Normal rate and regular rhythm.     Pulses: Normal pulses.  Pulmonary:     Effort: Pulmonary effort is normal.  Abdominal:     General: Abdomen is flat.     Palpations: Abdomen is soft.  Musculoskeletal:     Cervical back: Normal range of motion.  Skin:    General: Skin is dry.     Capillary Refill: Capillary refill takes less than 2 seconds.     Coloration: Skin is pale.  Neurological:     Mental Status: He is disoriented.    PPS: 30%   This conversation/these recommendations were discussed with patient primary care team.  Time In: 0800 Time Out: 0915 Total Time: 75 Greater than 50%  of this time was spent counseling and coordinating care related to the above assessment and plan.  [address] [place] Palliative Medicine Team Team Cell Phone: [phone] Please utilize secure chat with additional questions, if there is no response within 30 minutes please call the above phone number  Palliative Medicine Team providers are available by phone from 7am to 7pm daily and can be reached through the team cell phone.  Should this patient require assistance outside of these hours, please call the patient's attending physician.

## 2019-11-29 DIAGNOSIS — Z66 Do not resuscitate: Secondary | ICD-10-CM

## 2019-11-29 LAB — CBC
HCT: 38.2 % — ABNORMAL LOW (ref 39.0–52.0)
Hemoglobin: 12.6 g/dL — ABNORMAL LOW (ref 13.0–17.0)
MCH: 31.3 pg (ref 26.0–34.0)
MCHC: 33 g/dL (ref 30.0–36.0)
MCV: 95 fL (ref 80.0–100.0)
Platelets: 150 10*3/uL (ref 150–400)
RBC: 4.02 MIL/uL — ABNORMAL LOW (ref 4.22–5.81)
RDW: 13.5 % (ref 11.5–15.5)
WBC: 20.7 10*3/uL — ABNORMAL HIGH (ref 4.0–10.5)
nRBC: 0 % (ref 0.0–0.2)

## 2019-11-29 LAB — BASIC METABOLIC PANEL
Anion gap: 6 (ref 5–15)
BUN: 26 mg/dL — ABNORMAL HIGH (ref 8–23)
CO2: 25 mmol/L (ref 22–32)
Calcium: 8.5 mg/dL — ABNORMAL LOW (ref 8.9–10.3)
Chloride: 102 mmol/L (ref 98–111)
Creatinine, Ser: 0.87 mg/dL (ref 0.61–1.24)
GFR calc Af Amer: >60 mL/min (ref >60)
GFR calc non Af Amer: >60 mL/min (ref >60)
Glucose, Bld: 112 mg/dL — ABNORMAL HIGH (ref 70–99)
Potassium: 3.6 mmol/L (ref 3.5–5.1)
Sodium: 133 mmol/L — ABNORMAL LOW (ref 135–145)

## 2019-11-29 LAB — MAGNESIUM: Magnesium: 2.1 mg/dL (ref 1.7–2.4)

## 2019-11-29 LAB — URINE CULTURE: Culture: >=100000 — AB

## 2019-11-29 MED ORDER — ACETAMINOPHEN 325 MG PO TABS: 650.0000 mg | ORAL_TABLET | Freq: Four times a day (QID) | ORAL | Status: DC | PRN

## 2019-11-29 MED ORDER — FOSFOMYCIN TROMETHAMINE 3 G PO PACK: 3.0000 g | PACK | ORAL | 0 refills | Status: AC

## 2019-11-29 MED FILL — FOSFOMYCIN TROMETHAMINE 3 G: 3 | 1 days supply | Qty: 2 | Fill #0

## 2019-11-29 NOTE — Progress Notes (Signed)
Physical Therapy Treatment Patient Details Name: Paul Hopkins MRN: [id number] DOB: 25-Mar-1934 Today's Date: 11/29/2019    History of Present Illness Pt is an 86 yo male presenting with AMS. PMH includes: dementia, hypothyroidism, CVA (2017), vascular dementia with delusional disorder, and Mnire's disease.    PT Comments    Pt OOB in recliner upon arrival of PT, agreeable to PT session with focus on progressing functional strengthening and mobility. The pt was able to complete a short circuit of sit-stand transfers x 5 followed by 30 sec standing high knee marches for 30 sec. He required seated rest between each round, but was able to complete with minA for sit-stand and minG during the marches. The pt was then able to complete 200 ft of hallway ambulation, but continues to require RW and close guard as he had 2 minor LOB requiring minA to recover as well as minA to assist with navigating around obstacles in the hallway. The pt will continue to benefit from skilled PT to further progress functional strength and stability to allow for increased independence and safety with mobility at home.    Follow Up Recommendations  Home health PT;Supervision/Assistance - 24 hour     Equipment Recommendations  None recommended by PT    Recommendations for Other Services       Precautions / Restrictions Precautions Precautions: Fall Precaution Comments: pt very HOH Restrictions Weight Bearing Restrictions: No    Mobility  Bed Mobility    General bed mobility comments: pt in recliner upon arrival  Transfers Overall transfer level: Needs assistance Equipment used: Rolling walker (2 wheeled) Transfers: Sit to/from Stand Sit to Stand: Min assist         General transfer comment: modA initially due to strong posterior lean and poor ability to shift wt forwards despite VC. progressed to minA through session, especially with eccentric lower as pt needs multiple VC each rep for hand  placement  Ambulation/Gait Ambulation/Gait assistance: Min guard Gait Distance (Feet): 200 Feet Assistive device: Rolling walker (2 wheeled) Gait Pattern/deviations: Step-through pattern;Decreased stride length;Shuffle Gait velocity: 0.29 m/s Gait velocity interpretation: <1.31 ft/sec, indicative of household ambulator General Gait Details: Pt with slow, small steps with almost no clearance. Pt needing minG primarily but had 2 minor LOB requiring minA as well as multiple episodes of minA needed for RW management.     Balance Overall balance assessment: Needs assistance Sitting-balance support: No upper extremity supported;Feet supported Sitting balance-Leahy Scale: Good Sitting balance - Comments: sitting at edge of recliner Postural control: Posterior lean Standing balance support: Bilateral upper extremity supported;During functional activity Standing balance-Leahy Scale: Poor Standing balance comment: pt reliant on BUE support and external support, strong posterior lean requiring modA at initial stand                            Cognition Arousal/Alertness: Awake/alert Behavior During Therapy: Flat affect;WFL for tasks assessed/performed Overall Cognitive Status: Impaired/Different from baseline Area of Impairment: Orientation;Memory;Following commands;Problem solving                 Orientation Level: Disoriented to;Place   Memory: Decreased short-term memory Following Commands: Follows one step commands consistently     Problem Solving: Requires verbal cues;Difficulty sequencing General Comments: pt continues to benefit from [facility] for sequencing, limited STM noted within session      Exercises Other Exercises Other Exercises: 5x STS followed by 30 sec High Knee standing march with RW. x 2  General Comments General comments (skin integrity, edema, etc.): VSS, HR to 80 with activity.      Pertinent Vitals/Pain Pain Assessment: No/denies pain            PT Goals (current goals can now be found in the care plan section) Acute Rehab PT Goals Patient Stated Goal: return to [facility] PT Goal Formulation: With patient Time For Goal Achievement: 12/12/19 Potential to Achieve Goals: Good Progress towards PT goals: Progressing toward goals    Frequency    Min 3X/week      PT Plan Current plan remains appropriate       AM-PAC PT "6 Clicks" Mobility   Outcome Measure  Help needed turning from your back to your side while in a flat bed without using bedrails?: A Little Help needed moving from lying on your back to sitting on the side of a flat bed without using bedrails?: A Little Help needed moving to and from a bed to a chair (including a wheelchair)?: A Little Help needed standing up from a chair using your arms (e.g., wheelchair or bedside chair)?: A Little Help needed to walk in hospital room?: A Little Help needed climbing 3-5 steps with a railing? : A Lot 6 Click Score: 17    End of Session Equipment Utilized During Treatment: Gait belt Activity Tolerance: Patient tolerated treatment well Patient left: in chair;with call bell/phone within reach;with nursing/sitter in room Nurse Communication: Mobility status PT Visit Diagnosis: Unsteadiness on feet (R26.81);Muscle weakness (generalized) (M62.81)     Time: 1696-7893 PT Time Calculation (min) (ACUTE ONLY): 32 min  Charges:  $Gait Training: 8-22 mins $Therapeutic Exercise: 8-22 mins                     Paul Hopkins, PT, DPT   Acute Rehabilitation Department Pager #: [phone]   Paul Hopkins 11/29/2019, 2:26 PM

## 2019-11-29 NOTE — Progress Notes (Signed)
DISCHARGE NOTE HOME Paul Hopkins to be discharged Home per MD order. Discussed prescriptions and follow up appointments with the patient. Prescriptions given to patient; medication list explained in detail. Patient verbalized understanding.  Skin clean, dry and intact without evidence of skin break down, no evidence of skin tears noted. IV catheter discontinued intact. Site without signs and symptoms of complications. Dressing and pressure applied. Pt denies pain at the site currently. No complaints noted.  Patient free of lines, drains, and wounds.   An After Visit Summary (AVS) was printed and given to the patient. Patient escorted via wheelchair, and discharged home via private auto.  Selina Cooley BSN, RN3

## 2019-11-29 NOTE — Discharge Summary (Signed)
[facility] Discharge Summary  Patient name: Paul Hopkins Medical record number: [id number] Date of birth: 1934-05-29 Age: 86 y.o. Gender: male Date of Admission: 11/27/2019  Date of Discharge: 11/29/2019 Admitting Physician: Gifford Shave, MD  Primary Care Provider: Patient, No Pcp Per Consultants: None  Indication for Hospitalization: Altered mental status  Discharge Diagnoses/Problem List:  Principal Problem:   Sepsis [facility]) Active Problems:   Palliative care by specialist   Goals of care, counseling/discussion   DNR (do not resuscitate)   Pseudomonas infection   Vascular dementia ([place])   Hypothyroidism (acquired)  Disposition: ALF  Discharge Condition: Stable  Discharge Exam:  General: Alert and oriented in no apparent distress Heart: S1, S2 present with no murmurs appreciated Lungs: CTA bilaterally, no wheezing Abdomen: Bowel sounds present, no abdominal pain, no suprapubic discomfort Extremities: No lower extremity edema  Brief Hospital Course:  Patient admitted with concern for altered mental status as patient had been somewhat more lethargic than normal.  Patient had also been complaining of difficulty attempting urination.  In the emergency department patient had signs of a urinary tract infection 3 urinalysis started on ceftriaxone.  Urine cultures resulted showing Pseudomonas which was pansensitive.  Because of a previous history to ciprofloxacin the decision was made to discharge patient on fosfomycin as an oral antibiotic which she would take 2 doses, 1 on 6/18 in the evening and 1 on 6/20 1 in the evening.  It was thought that the patient having some urinary retention symptoms was partially the cause behind his urinary tract infection, however upon discussed with patient about starting medication such as Flomax the patient was not interested at the time of discharge.  Issues for Follow Up:  1. Patient discharged on fosfomycin, 2  doses 2. Discussed with patient the option of discharging with Flomax for urinary tension, however patient is not interested in this at this time.  Can consider on follow-up appointment if patient continues to have urinary retention symptoms.  Significant Procedures: None  Significant Labs and Imaging:  Recent Labs  Lab 11/27/19 1540 11/28/19 0457 11/29/19 0615  WBC 23.1* 23.7* 20.7*  HGB 14.0 12.9* 12.6*  HCT 43.5 37.9* 38.2*  PLT 189 153 150   Recent Labs  Lab 11/25/19 0818 11/25/19 0818 11/27/19 1540 11/27/19 1540 11/28/19 0457 11/29/19 0615  NA 140  --  136  --  136 133*  K 4.7   < > 4.1   < > 3.7 3.6  CL 103  --  99  --  102 102  CO2 30  --  26  --  24 25  GLUCOSE 117*  --  156*  --  138* 112*  BUN 32*  --  31*  --  28* 26*  CREATININE 1.21  --  1.30*  --  0.91 0.87  CALCIUM 9.6  --  9.3  --  8.8* 8.5*  MG  --   --   --   --  1.8 2.1  ALKPHOS 54  --  52  --  47  --   AST 27  --  38  --  33  --   ALT 18  --  19  --  18  --   ALBUMIN 3.9  --  3.3*  --  2.7*  --    < > = values in this interval not displayed.      Results/Tests Pending at Time of Discharge: Blood cultures pending, negative at 48 hours  Discharge Medications:  Allergies as of 11/29/2019      Reactions   Amoxicillin Other (See Comments)   Reaction??   Banana Other (See Comments)   Hip pain   Bean Pod Extract Diarrhea, Other (See Comments)   Explosive diarrhea   Blackberry [rubus Fruticosus] Other (See Comments)   Flares the patient's Mnire's disease   Carrot Oil Diarrhea, Other (See Comments)   Explosive diarrhea   Ciprofloxacin Other (See Comments)   Reaction??   Citric Acid Diarrhea, Other (See Comments)   Explosive diarrhea   Citrus Diarrhea, Other (See Comments)   Explosive diarrhea   Corn-containing Products Other (See Comments)   Explosive diarrhea   Egg [eggs Or Egg-derived Products] Diarrhea, Other (See Comments)   Explosive diarrhea   Milk-related Compounds Diarrhea, Other  (See Comments)   Explosive diarrhea   Other Diarrhea, Other (See Comments)   NO BERRIES WITH TINY SEEDS: Flares the patient's Mnire's disease  Bread- Explosive diarrhea   Peanut-containing Drug Products Diarrhea, Other (See Comments)   NO LEGUMES!! NO PEAS!! Explosive diarrhea   Raspberry Other (See Comments)   Flares the patient's Mnire's disease   Strawberry Extract Other (See Comments)   Flares the patient's Mnire's disease   Sulfa Antibiotics Other (See Comments)   Reaction??   Trimethoprim Other (See Comments)   Reaction??   Whey Diarrhea, Other (See Comments)   Explosive diarrhea      Medication List    TAKE these medications   acetaminophen 325 MG tablet Commonly known as: TYLENOL Take 2 tablets (650 mg total) by mouth every 6 (six) hours as needed for mild pain, moderate pain, fever or headache.   ARIPiprazole 10 MG tablet Commonly known as: ABILIFY Take 10 mg by mouth daily.   Digestive Enzyme Caps Take 1 capsule by mouth See admin instructions. Take 1 capsule by mouth at 5 PM daily   fosfomycin 3 g Pack Commonly known as: MONUROL Take 3 g by mouth every 3 (three) days for 2 doses. First dose 6/18 at 6pm. Second dose on 6/21 at 6pm   levothyroxine 88 MCG tablet Commonly known as: SYNTHROID Take 88 mcg by mouth daily before breakfast.   SODIUM FLUORIDE PO 30 mLs by Mouth Rinse route in the morning.       Discharge Instructions: Please refer to Patient Instructions section of EMR for full details.  Patient was counseled important signs and symptoms that should prompt return to medical care, changes in medications, dietary instructions, activity restrictions, and follow up appointments.   Follow-Up Appointments:   Jackelyn Poling, DO 11/29/2019, 12:19 PM PGY-1, [facility]

## 2019-11-29 NOTE — Progress Notes (Signed)
   Palliative Medicine Inpatient Follow Up Note   Reason for consult:  Code Status  HPI:  Per intake H&P --> Paul Hopkins a 86 y.o.malepresenting with altered mental status. PMH is significant forvascular dementia,hypothyroidism, delusional disorder, Mnire's disease. Patient was admitted to [facility] for worsening lethargy and fever thought to be related to a urinary tract infection.   Palliative care was consulted to review code status.  Today's Discussion (11/29/2019): Chart reviewed. Patient appears to be doing well this morning. He is in no distress and eating breakfast at the time of assessment. Per review of PT notes the plan will be for return to [place] with home health.  Discussed the importance of continued conversation with family and their  medical providers regarding overall plan of care and treatment options, ensuring decisions are within the context of the patients values and GOCs.  Provided"Hard Choices for [employer]" booklet.   Questions and concerns addressed   Recommendations and Plan: SUMMARY OF RECOMMENDATIONS DNAR/DNI  DNR Order --> In Media Section of Chart  Copy of living will placed in chart  Time Spent: 25 Greater than 50% of the time was spent in counseling and coordination of care ______________________________________________________________________________________ Lamarr Lulas [facility] Palliative Medicine Team Team Cell Phone: [phone] Please utilize secure chat with additional questions, if there is no response within 30 minutes please call the above phone number  Palliative Medicine Team providers are available by phone from 7am to 7pm daily and can be reached through the team cell phone.  Should this patient require assistance outside of these hours, please call the patient's attending physician.

## 2019-11-29 NOTE — TOC Transition Note (Signed)
Transition of Care [facility]) - CM/SW Discharge Note   Patient Details  Name: Paul Hopkins MRN: [id number] Date of Birth: Nov 28, 1933  Transition of Care [facility]) CM/SW Contact:  Bess Kinds, RN Phone Number: [phone] 11/29/2019, 12:27 PM   Clinical Narrative:     Notified by nursing of patient transitioning back to [facility] today. HH PT and OT orders faxed to [facility]. Notified Rene Kocher of orders faxed and patient discharge. Daughter at bedside and will transport patient in private vehicle. No further TOC needs identified.   Final next level of care: Home w Home Health Services Barriers to Discharge: No Barriers Identified   Patient Goals and CMS Choice Patient states their goals for this hospitalization and ongoing recovery are:: return home to [facility] Medicare.gov Compare Post Acute Care list provided to:: Patient Choice offered to / list presented to : Adult Children  Discharge Placement                       Discharge Plan and Services In-house Referral: NA Discharge Planning Services: CM Consult Post Acute Care Choice: Home Health          DME Arranged: N/A DME Agency: NA       HH Arranged: PT, OT HH Agency: Other - See comment International aid/development worker) Date HH Agency Contacted: 11/29/19 Time HH Agency Contacted: 1227 Representative spoke with at [facility] Agency: Rene Kocher  Social Determinants of Health (SDOH) Interventions     Readmission Risk Interventions No flowsheet data found.

## 2019-12-02 LAB — CULTURE, BLOOD (ROUTINE X 2)
Culture: NO GROWTH
Special Requests: ADEQUATE

## 2020-02-13 ENCOUNTER — Emergency Department (HOSPITAL_COMMUNITY)
Admission: EM | Admit: 2020-02-13 | Discharge: 2020-02-14 | Disposition: A | Discharge status: Home / Self Care | Payer: Medicare Other | Attending: Emergency Medicine | Admitting: Emergency Medicine

## 2020-02-13 ENCOUNTER — Emergency Department (HOSPITAL_COMMUNITY): Payer: Medicare Other

## 2020-02-13 ENCOUNTER — Other Ambulatory Visit: Payer: Self-pay

## 2020-02-13 DIAGNOSIS — Z91010 Allergy to peanuts: Secondary | ICD-10-CM | POA: Insufficient documentation

## 2020-02-13 DIAGNOSIS — R29810 Facial weakness: Secondary | ICD-10-CM | POA: Diagnosis not present

## 2020-02-13 DIAGNOSIS — R4182 Altered mental status, unspecified: Secondary | ICD-10-CM | POA: Diagnosis not present

## 2020-02-13 DIAGNOSIS — Z20822 Contact with and (suspected) exposure to covid-19: Secondary | ICD-10-CM | POA: Diagnosis not present

## 2020-02-13 DIAGNOSIS — I639 Cerebral infarction, unspecified: Secondary | ICD-10-CM | POA: Diagnosis not present

## 2020-02-13 DIAGNOSIS — E039 Hypothyroidism, unspecified: Secondary | ICD-10-CM | POA: Insufficient documentation

## 2020-02-13 DIAGNOSIS — Z79899 Other long term (current) drug therapy: Secondary | ICD-10-CM | POA: Diagnosis not present

## 2020-02-13 DIAGNOSIS — R531 Weakness: Secondary | ICD-10-CM | POA: Diagnosis present

## 2020-02-13 DIAGNOSIS — Z79890 Hormone replacement therapy (postmenopausal): Secondary | ICD-10-CM | POA: Diagnosis not present

## 2020-02-13 LAB — I-STAT CHEM 8, ED
BUN: 29 mg/dL — ABNORMAL HIGH (ref 8–23)
Calcium, Ion: 1.14 mmol/L — ABNORMAL LOW (ref 1.15–1.40)
Chloride: 102 mmol/L (ref 98–111)
Creatinine, Ser: 0.7 mg/dL (ref 0.61–1.24)
Glucose, Bld: 116 mg/dL — ABNORMAL HIGH (ref 70–99)
HCT: 41 % (ref 39.0–52.0)
Hemoglobin: 13.9 g/dL (ref 13.0–17.0)
Potassium: 4.1 mmol/L (ref 3.5–5.1)
Sodium: 137 mmol/L (ref 135–145)
TCO2: 24 mmol/L (ref 22–32)

## 2020-02-13 LAB — COMPREHENSIVE METABOLIC PANEL
ALT: 12 U/L (ref 0–44)
AST: 27 U/L (ref 15–41)
Albumin: 3.4 g/dL — ABNORMAL LOW (ref 3.5–5.0)
Alkaline Phosphatase: 65 U/L (ref 38–126)
Anion gap: 8 (ref 5–15)
BUN: 26 mg/dL — ABNORMAL HIGH (ref 8–23)
CO2: 24 mmol/L (ref 22–32)
Calcium: 9.1 mg/dL (ref 8.9–10.3)
Chloride: 101 mmol/L (ref 98–111)
Creatinine, Ser: 0.97 mg/dL (ref 0.61–1.24)
GFR calc Af Amer: >60 mL/min (ref >60)
GFR calc non Af Amer: >60 mL/min (ref >60)
Glucose, Bld: 117 mg/dL — ABNORMAL HIGH (ref 70–99)
Potassium: 4.6 mmol/L (ref 3.5–5.1)
Sodium: 133 mmol/L — ABNORMAL LOW (ref 135–145)
Total Bilirubin: 1.5 mg/dL — ABNORMAL HIGH (ref 0.3–1.2)
Total Protein: 5.7 g/dL — ABNORMAL LOW (ref 6.5–8.1)

## 2020-02-13 LAB — AMMONIA: Ammonia: 27 umol/L (ref 9–35)

## 2020-02-13 LAB — SARS CORONAVIRUS 2 BY RT PCR (HOSPITAL ORDER, PERFORMED IN ~~LOC~~ HOSPITAL LAB): SARS Coronavirus 2: NEGATIVE

## 2020-02-13 LAB — ETHANOL: Alcohol, Ethyl (B): <10 mg/dL (ref <10)

## 2020-02-13 LAB — TSH: TSH: 1.715 u[IU]/mL (ref 0.350–4.500)

## 2020-02-13 LAB — PROTIME-INR
INR: 1.2 (ref 0.8–1.2)
Prothrombin Time: 14.7 seconds (ref 11.4–15.2)

## 2020-02-13 LAB — APTT: aPTT: 22 seconds — ABNORMAL LOW (ref 24–36)

## 2020-02-13 MED ORDER — IOHEXOL 350 MG/ML SOLN
60.0000 mL | Freq: Once | INTRAVENOUS | Status: AC | PRN
  Administered 2020-02-13: 60 mL via INTRAVENOUS

## 2020-02-13 MED ORDER — LORAZEPAM 2 MG/ML IJ SOLN
1.0000 mg | Freq: Once | INTRAMUSCULAR | Status: AC
  Administered 2020-02-13: 1 mg via INTRAVENOUS
  Filled 2020-02-13: qty 1

## 2020-02-13 NOTE — ED Triage Notes (Signed)
Pt presents to ED as Code Stroke, brought from from [facility] by [facility]. EMS called out for evaluation of Lf sided weakness/lean, EMS noted Lf sided facial droop. Per EMS Pt's LKW was at 1700. MD. Amada Jupiter present on arrival to ED.  BP 136/87 HR 78 R 16 98% RA CBG 130

## 2020-02-13 NOTE — Consult Note (Addendum)
Neurology Consultation Reason for Consult: Left sided wekaness Referring Physician: Silverio Lay, D  CC: Left facial droop  History is obtained from: EMS, daughter  HPI: Paul Hopkins is a 86 y.o. male with a history of dementia and hypothyroidism who presents with leftward lean and facial droop that has been present least and some degree since this morning.  He states that he had trouble getting out of bed, and actually saw a nurse practitioner for it, however he told her he felt fine during it.  When I ask him if he has felt completely back to normal at any point during the day he states he has not.    LKW: 9/1 prior to bed tpa given?: no, out of window    ROS: Difficult to obtain due to severe hard of hearing  Past Medical History:  Diagnosis Date  . Dementia (HCC)   . Hypothyroidism   . Stroke [facility])      FHx: Difficult to obtain due to severely hard of hearing  Social History:  reports that he has never smoked. He has never used smokeless tobacco. No history on file for alcohol use and drug use.   Exam: Current vital signs: There were no vitals taken for this visit. Vital signs in last 24 hours:     Physical Exam  Constitutional: Appears well-developed and well-nourished.  Psych: Slightly flattened affect Eyes: No scleral injection HENT: No OP obstruction MSK: no joint deformities.  Cardiovascular: Normal rate and regular rhythm.  Respiratory: Effort normal, non-labored breathing GI: Soft.  No distension. There is no tenderness.  Skin: WDI  Neuro: Mental Status: Patient is awake, alert, interactive and appropriate patient is able to give a clear and coherent history. No signs of aphasia or neglect Cranial Nerves: II: Visual Fields are full. Pupils are equal, round, and reactive to light.  III,IV, VI: EOMI without ptosis or diploplia.  He has mild nystagmus on rightward gaze which fatigues. V: Facial sensation is symmetric to temperature VII: Facial movement with  left facial weakness VIII: hearing is intact to voice X: Uvula elevates symmetrically XI: Shoulder shrug is symmetric. XII: tongue is midline without atrophy or fasciculations.  Motor: He has mildly increased tone with some cogwheeling in the left upper extremity, to confrontation he has apparently symmetric strength, no drift in his upper extremities.  He does drift in his left lower extremity. Sensory: Sensation is symmetric to light touch in the arms and legs. Cerebellar: He performs finger-nose-finger slowly, but without obvious ataxia  I have reviewed labs in epic and the results pertinent to this consultation are: Creatinine 0.7  I have reviewed the images obtained: CT head-no acute findings  Impression: 86 year old male with left lean and left facial weakness.  Iit is possible he has had a small ischemic stroke, would confirm with MRI.  He is outside the window for IV TPA and his symptoms are too mild to consider arterial intervention. He has some mildly parkinsonian features as well, and if he has some other physiological stressor, these could be responsible for his leaning. Also possible would be recrudescence.  Recommendations: 1) MRI brain, stroke workup if positive.  2) If negative, would assess for physiological stressors like infection, also check thyroid, ammonia.   Ritta Slot, MD [facility] [phone]  If 7pm- 7am, please page neurology on call as listed in AMION.

## 2020-02-13 NOTE — ED Notes (Signed)
Assumed care on patient , patient resting /sleeping , respirations unlabored , denies pain , IV sites intact , repositioned on bed for comfort , family at bedside .

## 2020-02-13 NOTE — ED Notes (Signed)
Returned from MRI 

## 2020-02-13 NOTE — ED Provider Notes (Signed)
[facility] EMERGENCY DEPARTMENT Provider Note   CSN: [id number] Arrival date & time: 02/13/20  1928     History No chief complaint on file.   Paul Hopkins is a 86 y.o. male history of dementia, previous stroke here presenting with altered mental status.  Patient is from a nursing home.  He leans to the left at baseline and around 5 PM, he was noted to be leaning much more to the left.  He also is speaking less than usual.  Patient was noted to have a right facial droop for EMS and leaning to the left so code stroke was activated.  Patient was recently admitted for sepsis from UTI.  Patient unable to give much history.  The history is provided by the nursing home and the EMS personnel.  Level V caveat- AMS, dementia      Past Medical History:  Diagnosis Date  . Dementia (HCC)   . Hypothyroidism   . Stroke [facility])     Patient Active Problem List   Diagnosis Date Noted  . Pseudomonas infection 11/28/2019  . Vascular dementia (HCC) 11/28/2019  . Hypothyroidism (acquired) 11/28/2019  . Palliative care by specialist   . Goals of care, counseling/discussion   . DNR (do not resuscitate)   . Sepsis (HCC) 11/27/2019        No family history on file.  Social History   Tobacco Use  . Smoking status: Never Smoker  . Smokeless tobacco: Never Used  Substance Use Topics  . Alcohol use: Not on file  . Drug use: Not on file    Home Medications Prior to Admission medications   Medication Sig Start Date End Date Taking? Authorizing Provider  acetaminophen (TYLENOL) 325 MG tablet Take 2 tablets (650 mg total) by mouth every 6 (six) hours as needed for mild pain, moderate pain, fever or headache. 11/29/19   Mirian Mo, MD  ARIPiprazole (ABILIFY) 10 MG tablet Take 10 mg by mouth daily.    [provider]  Digestive Enzyme CAPS Take 1 capsule by mouth See admin instructions. Take 1 capsule by mouth at 5 PM daily    [provider]  levothyroxine  (SYNTHROID) 88 MCG tablet Take 88 mcg by mouth daily before breakfast.    [provider]  SODIUM FLUORIDE PO 30 mLs by Mouth Rinse route in the morning.    [provider]    Allergies    Amoxicillin, Banana, Bean pod extract, Blackberry [rubus fruticosus], Carrot oil, Ciprofloxacin, Citric acid, Citrus, Corn-containing products, Egg [eggs or egg-derived products], Milk-related compounds, Other, Peanut-containing drug products, Raspberry, Strawberry extract, Sulfa antibiotics, Trimethoprim, and Whey  Review of Systems   Review of Systems  Neurological: Positive for weakness.  Psychiatric/Behavioral: Positive for confusion.  All other systems reviewed and are negative.   Physical Exam Updated Vital Signs There were no vitals taken for this visit.  Physical Exam Vitals and nursing note reviewed.  Constitutional:      Comments: Confused, chronically ill   HENT:     Head: Normocephalic.     Nose: Nose normal.     Mouth/Throat:     Mouth: Mucous membranes are dry.  Eyes:     Extraocular Movements: Extraocular movements intact.     Pupils: Pupils are equal, round, and reactive to light.  Cardiovascular:     Rate and Rhythm: Normal rate and regular rhythm.     Pulses: Normal pulses.  Pulmonary:     Effort: Pulmonary effort is normal.  Abdominal:     General: Abdomen is flat.  Musculoskeletal:        General: Normal range of motion.     Cervical back: Normal range of motion.  Skin:    General: Skin is warm.  Neurological:     Comments: Confused, leaning towards the left.  Strength is 4 out of 5 on the left and 5 out of 5 on the right.  Psychiatric:     Comments: Unable      ED Results / Procedures / Treatments   Labs (all labs ordered are listed, but only abnormal results are displayed) Labs Reviewed  I-STAT CHEM 8, ED - Abnormal; Notable for the following components:      Result Value   BUN 29 (*)    Glucose, Bld 116 (*)    Calcium, Ion 1.14 (*)      All other components within normal limits  ETHANOL  PROTIME-INR  APTT  CBC  DIFFERENTIAL  COMPREHENSIVE METABOLIC PANEL  RAPID URINE DRUG SCREEN, HOSP PERFORMED  URINALYSIS, ROUTINE W REFLEX MICROSCOPIC    EKG None  Radiology No results found.  Procedures Procedures (including critical care time)  Medications Ordered in ED Medications  iohexol (OMNIPAQUE) 350 MG/ML injection 60 mL (60 mLs Intravenous Contrast Given 02/13/20 1952)    ED Course  I have reviewed the triage vital signs and the nursing notes.  Pertinent labs & imaging results that were available during my care of the patient were reviewed by me and considered in my medical decision making (see chart for details).    MDM Rules/Calculators/A&P                          Paul Hopkins is a 86 y.o. male here presenting with leaning towards the left and left-sided weakness.  Patient has history of vascular dementia.  Unclear when the last normal was but was reported around 5 PM.  Neurology at bedside and code stroke was activated by EMS. Plan to get CT a head and neck.   11:26 PM MRI did not show a stroke.  Dr. Amada Jupiter recommend TSH and pneumonia which is normal.  However his CBC is still pending.  Given that he had recent admission for UTI causing altered mental status, will In-N-Out cath to obtain urine.  If his UA showed UTI, he likely will need admission for altered mental status.  If his UA did not show any UTI, anticipate discharge back to facility and daughter is aware of that.  Signed out to Dr. Clayborne Dana in the ED.  Final Clinical Impression(s) / ED Diagnoses Final diagnoses:  None    Rx / DC Orders ED Discharge Orders    None       Charlynne Pander, MD 02/13/20 2327

## 2020-02-13 NOTE — ED Notes (Addendum)
Patient transported to MRI 

## 2020-02-13 NOTE — Code Documentation (Signed)
Responded to Code Stroke called at [facility] for L facial droop, LSN originally 1700, however, this was changed to the night of 9/1 once pt arrived and more information was obtained. Pt arrived at 1928, CBG-117, NIH-4, CT head negative for acute changes. CTA-no LVO. Plan for MRI.

## 2020-02-14 DIAGNOSIS — I639 Cerebral infarction, unspecified: Secondary | ICD-10-CM | POA: Diagnosis not present

## 2020-02-14 LAB — RAPID URINE DRUG SCREEN, HOSP PERFORMED
Amphetamines: NOT DETECTED
Barbiturates: NOT DETECTED
Benzodiazepines: NOT DETECTED
Cocaine: NOT DETECTED
Opiates: NOT DETECTED
Tetrahydrocannabinol: NOT DETECTED

## 2020-02-14 LAB — CBC WITH DIFFERENTIAL/PLATELET
Abs Immature Granulocytes: 0.06 10*3/uL (ref 0.00–0.07)
Basophils Absolute: 0 10*3/uL (ref 0.0–0.1)
Basophils Relative: 0 %
Eosinophils Absolute: 0 10*3/uL (ref 0.0–0.5)
Eosinophils Relative: 0 %
HCT: 38.5 % — ABNORMAL LOW (ref 39.0–52.0)
Hemoglobin: 12.5 g/dL — ABNORMAL LOW (ref 13.0–17.0)
Immature Granulocytes: 0 %
Lymphocytes Relative: 6 %
Lymphs Abs: 0.8 10*3/uL (ref 0.7–4.0)
MCH: 30.9 pg (ref 26.0–34.0)
MCHC: 32.5 g/dL (ref 30.0–36.0)
MCV: 95.3 fL (ref 80.0–100.0)
Monocytes Absolute: 1.2 10*3/uL — ABNORMAL HIGH (ref 0.1–1.0)
Monocytes Relative: 8 %
Neutro Abs: 12.2 10*3/uL — ABNORMAL HIGH (ref 1.7–7.7)
Neutrophils Relative %: 86 %
Platelets: 180 10*3/uL (ref 150–400)
RBC: 4.04 MIL/uL — ABNORMAL LOW (ref 4.22–5.81)
RDW: 13 % (ref 11.5–15.5)
WBC: 14.3 10*3/uL — ABNORMAL HIGH (ref 4.0–10.5)
nRBC: 0 % (ref 0.0–0.2)

## 2020-02-14 LAB — URINALYSIS, ROUTINE W REFLEX MICROSCOPIC
Bilirubin Urine: NEGATIVE
Glucose, UA: NEGATIVE mg/dL
Hgb urine dipstick: NEGATIVE
Ketones, ur: 5 mg/dL — AB
Nitrite: NEGATIVE
Protein, ur: NEGATIVE mg/dL
Specific Gravity, Urine: >1.046 — ABNORMAL HIGH (ref 1.005–1.030)
pH: 5 (ref 5.0–8.0)

## 2020-02-14 MED ORDER — FOSFOMYCIN TROMETHAMINE 3 G PO PACK
3.0000 g | PACK | Freq: Once | ORAL | Status: AC
  Administered 2020-02-14: 3 g via ORAL
  Filled 2020-02-14: qty 3

## 2020-02-14 NOTE — ED Notes (Signed)
Patient waiting for PTAR for transport back to [facility] , sleeping with no distress/respirations unlabored.

## 2020-02-14 NOTE — ED Provider Notes (Signed)
12:25 AM Assumed care from Dr. Silverio Lay, please see their note for full history, physical and decision making until this point. In brief this is a 86 y.o. year old male who presented to the ED tonight with Code Stroke     Thought to be a code stroke but did not really have any new symptoms with possibly worsening of old symptoms.  MRI and CTs were negative for any acute infarct and thus it was thought that maybe there was a metabolic or infectious cause for the symptoms however the metabolic work-up was negative.  Pending a urinalysis that he did have urosepsis a couple months ago.  Plan would be as if this is negative to go ahead and discharge him.  His urine showed rare bacteria 21-50 white blood cells no red blood cells and moderate leukocytes.  He has a history of urinary retention, this is nitrite negative and nowhere near what it was before.  He does have a elevated white count of 14.3 that could be caused by stress demargination.  He is afebrile and has been the time is been here.  He has no other evidence of sepsis.  I think this is unlikely an infection however with his history of being uroseptic in the past I will give 1 dose of fosfomycin here and send a urine culture.  If this came back positive he would need to be called in on some antibiotics or return to the hospital.  Will be discharged back to the facility.   Labs, studies and imaging reviewed by myself and considered in medical decision making if ordered. Imaging interpreted by radiology.  Labs Reviewed  APTT - Abnormal; Notable for the following components:      Result Value   aPTT 22 (*)    All other components within normal limits  COMPREHENSIVE METABOLIC PANEL - Abnormal; Notable for the following components:   Sodium 133 (*)    Glucose, Bld 117 (*)    BUN 26 (*)    Total Protein 5.7 (*)    Albumin 3.4 (*)    Total Bilirubin 1.5 (*)    All other components within normal limits  URINALYSIS, ROUTINE W REFLEX MICROSCOPIC -  Abnormal; Notable for the following components:   Specific Gravity, Urine >1.046 (*)    Ketones, ur 5 (*)    Leukocytes,Ua MODERATE (*)    Bacteria, UA RARE (*)    All other components within normal limits  CBC WITH DIFFERENTIAL/PLATELET - Abnormal; Notable for the following components:   WBC 14.3 (*)    RBC 4.04 (*)    Hemoglobin 12.5 (*)    HCT 38.5 (*)    Neutro Abs 12.2 (*)    Monocytes Absolute 1.2 (*)    All other components within normal limits  I-STAT CHEM 8, ED - Abnormal; Notable for the following components:   BUN 29 (*)    Glucose, Bld 116 (*)    Calcium, Ion 1.14 (*)    All other components within normal limits  SARS CORONAVIRUS 2 BY RT PCR (HOSPITAL ORDER, PERFORMED IN [facility])  URINE CULTURE  ETHANOL  PROTIME-INR  AMMONIA  TSH  DIFFERENTIAL  RAPID URINE DRUG SCREEN, HOSP PERFORMED    MR BRAIN WO CONTRAST  Final Result    DG Chest Port 1 View  Final Result    CT HEAD CODE STROKE WO CONTRAST  Final Result    CT Code Stroke CTA Head W/WO contrast  Final Result  CT Code Stroke CTA Neck W/WO contrast  Final Result      No follow-ups on file.    Naliya Gish, Barbara Cower, MD 02/14/20 [phone]

## 2020-02-14 NOTE — ED Notes (Signed)
Report given to [facility] senior living facility . PTAR notified by Diplomatic Services operational officer for transport.

## 2020-02-15 LAB — URINE CULTURE: Culture: 30000 — AB

## 2020-02-16 ENCOUNTER — Telehealth: Payer: Self-pay

## 2020-02-16 NOTE — Telephone Encounter (Signed)
Post ED Visit - Positive Culture Follow-up  Culture report reviewed by antimicrobial stewardship pharmacist: [facility] []  , Pharm.D. []  Enzo Bi, Pharm.D., BCPS AQ-ID []  , Pharm.D., BCPS []  Celedonio Miyamoto, .D., BCPS []  [place], .D., BCPS, AAHIVP []  Georgina Pillion, Pharm.D., BCPS, AAHIVP [x]  [address], PharmD, BCPS []  , PharmD, BCPS []  [place], PharmD, BCPS []  [place], PharmD []  , PharmD, BCPS []  Estella Husk, PharmD  Pharmacy Team []  Lysle Pearl, PharmD []  , PharmD []  Phillips Climes, PharmD []  , Rph []  Agapito Games) , PharmD []  Verlan Friends, PharmD []  , PharmD []  Mervyn Gay, PharmD []  , PharmD []  Vinnie Level, PharmD []  [facility], PharmD []  , PharmD []  Len Childs, PharmD  None needed Positive urine culture  and no further patient follow-up is required at this time.  02/16/2020, 10:41 AM

## 2020-05-11 ENCOUNTER — Other Ambulatory Visit (HOSPITAL_COMMUNITY): Payer: Self-pay

## 2020-05-11 DIAGNOSIS — R1310 Dysphagia, unspecified: Secondary | ICD-10-CM

## 2020-05-18 ENCOUNTER — Encounter (HOSPITAL_COMMUNITY): Payer: Medicare Other

## 2020-05-18 ENCOUNTER — Ambulatory Visit (HOSPITAL_COMMUNITY): Payer: Medicare Other

## 2020-05-21 ENCOUNTER — Ambulatory Visit (HOSPITAL_COMMUNITY)
Admission: RE | Admit: 2020-05-21 | Discharge: 2020-05-21 | Disposition: A | Discharge status: Home / Self Care | Payer: Medicare Other | Source: Ambulatory Visit | Attending: Nurse Practitioner | Admitting: Nurse Practitioner

## 2020-05-21 ENCOUNTER — Other Ambulatory Visit: Payer: Self-pay

## 2020-05-21 DIAGNOSIS — R1310 Dysphagia, unspecified: Secondary | ICD-10-CM | POA: Insufficient documentation

## 2020-07-02 ENCOUNTER — Encounter: Payer: Self-pay | Admitting: Gastroenterology

## 2020-08-06 ENCOUNTER — Ambulatory Visit (INDEPENDENT_AMBULATORY_CARE_PROVIDER_SITE_OTHER): Payer: Medicare Other | Admitting: Gastroenterology

## 2020-08-06 ENCOUNTER — Encounter: Payer: Self-pay | Admitting: Gastroenterology

## 2020-08-06 ENCOUNTER — Other Ambulatory Visit (INDEPENDENT_AMBULATORY_CARE_PROVIDER_SITE_OTHER): Payer: Medicare Other

## 2020-08-06 ENCOUNTER — Other Ambulatory Visit: Payer: Self-pay

## 2020-08-06 VITALS — BP 110/62 | HR 67 | Ht 70.0 in | Wt 160.0 lb

## 2020-08-06 DIAGNOSIS — R197 Diarrhea, unspecified: Secondary | ICD-10-CM

## 2020-08-06 DIAGNOSIS — E639 Nutritional deficiency, unspecified: Secondary | ICD-10-CM

## 2020-08-06 LAB — HIGH SENSITIVITY CRP: CRP, High Sensitivity: 0.34 mg/L (ref 0.000–5.000)

## 2020-08-06 NOTE — Progress Notes (Addendum)
Referring Provider: Joycelyn Man* Primary Care Physician:  Joycelyn Man, NP  Reason for Consultation: Chronic diarrhea and food intolerance   IMPRESSION:  Chronic diarrhea with recent weight gain Multiple food intolerances Oropharyngeal dysphagia on mechanical soft diet Prior colonoscopy in [place] >10 years ago  Etiology of his chronic symptoms is unclear.  Contrasted CT scan showed no obvious malignancy.  He does not appear to be a good candidate for colonoscopy.  However, we agreed to proceed for evaluation of causes of chronic diarrhea.  Trial of empiric Pepto-Bismol for symptom control in the meantime.  The patient and his daughter were alerted that his stools would become black while using Pepto-Bismol.  I think it would be reasonable to consider a trial of Xifaxan for possible small intestinal bacterial overgrowth if his symptoms persist.  His daughter is most concerned about his nutrition.  I have suggested a nutrition consult to help address dietary recommendations with his oral pharyngeal dysphagia as well as options for minimizing symptoms with his identified food intolerances.   PLAN: - TTGA, IgA - CRP, Fecal calprotectin - GI pathogen - Nutrition Consult - Trial of bismuth salicylate (Pepto-Bismol) two tablets TID to prevent diarrhea - Consider a trial of Xifaxan 550 mg TID x 2 weeks for possible SIBO - Obtain recent labs from NP Neale Burly to screen for anemia or electrolyte abnormalities   I spent over 45 minutes, including in depth chart review, independent review of results, , face-to-face time with the patient, coordinating care, ordering studies and medications as appropriate, and documentation.   HPI: Paul Hopkins is a 87 y.o. male referred by NP Kelli Hope for further evaluation of chronic diarrhea and food intolerance.  The patient has vascular dementia with delusional disorder diagnosed 16 months ago, hypothyroidism, CVA in 2017,  history of Mnire's disease with a hearing deficit.  He was diagnosed with IBS in the past.  He lives at [facility] in independent living.  History is limited due to chronic cognitive deficits.  He is a retired Art gallery manager.  His daughter accompanies him to this appointment and provides his history.  In 1988 he developed gastroenteritis after a trip to the [place]. Food intolerance started at that time with citrus products.  He developed multiple additional food intolerances over the years since that time including all dairy, legumes including beans, peas, and peanuts, corn, anything with corn syrup or corn oil, broccoli, squash, carrots, bananas, and berries.  With exposure to these foods he will have loose explosive bowel movements with frequent accidents.  However bananas cause hip pain and berries aggravate his Mnire's disease.  Prior to living at [facility] he ate grains, fruits, and vegetables on a 4-day rotation which seem to prevent him from developing further intolerances. He recorded all of his food on a calendar to space out his consumption.   Diet is currently hamburger, shrimp, and fish. No longer eating chicken or other meats due to the difficulty chewy after losing 3 teeth over the last year.   He currently lives in independent living.  His daughter now completes his menus weekly. She is concerned about the frequency with which he is eating the few foods that are not identified as allergies.   She has had to buy him new, larger sized clothes over the holidays due to weight gain.  She feels this is related to the volume of hamburgers and [place] fries that he has been eating.  His daughter understands that he had a history of IBS.  Unclear if he ever saw a gastroenterologist for these symptoms. But, he has had at least one colonoscopy prior to 2011.  Details of that evaluation are not available.  Modified barium swallow 05/21/2020 showed mild oral pharyngeal dysphagia at mild aspiration  risk.  No penetration or aspiration.  No delay in the barium tablet.  No treatment recommended.  Mechanical soft diet recommended.  CT of the abdomen and pelvis without contrast for nausea vomiting and weakness 11/27/2019 showed periportal edema and trace pericholecystic fluid, enlarged prostate, aortic atherosclerosis Abdominal ultrasound 11/27/2019 showed a small volume ascites in the right upper quadrant but was otherwise normal (images personally reviewed)  TSH normal 02/13/20  His father had colon cancer.  Past Medical History:  Diagnosis Date  . Dementia (HCC)   . Hypothyroidism   . Stroke [facility])     Past Surgical History:  Procedure Laterality Date  . EXCISIONAL HEMORRHOIDECTOMY      Current Outpatient Medications  Medication Sig Dispense Refill  . ARIPiprazole (ABILIFY) 10 MG tablet Take 10 mg by mouth daily.    Marland Kitchen aspirin EC 81 MG tablet Take 81 mg by mouth daily. Swallow whole.    . Digestive Enzyme CAPS Take 1 capsule by mouth See admin instructions. Take 1 capsule by mouth at 5 PM daily    . levothyroxine (SYNTHROID) 88 MCG tablet Take 88 mcg by mouth daily before breakfast.    . SODIUM FLUORIDE PO 30 mLs by Mouth Rinse route in the morning.     No current facility-administered medications for this visit.    Allergies as of 08/06/2020 - Review Complete 08/06/2020  Allergen Reaction Noted  . Amoxicillin Other (See Comments) 10/14/2019  . Banana Other (See Comments) 11/27/2019  . Bean pod extract Diarrhea and Other (See Comments) 11/27/2019  . Blackberry [rubus fruticosus] Other (See Comments) 11/27/2019  . Carrot oil Diarrhea and Other (See Comments) 11/27/2019  . Ciprofloxacin Other (See Comments) 10/14/2019  . Citric acid Diarrhea and Other (See Comments) 11/27/2019  . Citrus Diarrhea and Other (See Comments) 11/27/2019  . Corn-containing products Other (See Comments) 11/27/2019  . Egg [eggs or egg-derived products] Diarrhea and Other (See Comments) 11/27/2019  .  Milk-related compounds Diarrhea and Other (See Comments) 11/27/2019  . Other Diarrhea and Other (See Comments) 11/27/2019  . Peanut-containing drug products Diarrhea and Other (See Comments) 11/27/2019  . Raspberry Other (See Comments) 11/27/2019  . Strawberry extract Other (See Comments) 11/27/2019  . Sulfa antibiotics Other (See Comments) 10/14/2019  . Trimethoprim Other (See Comments) 10/14/2019  . Whey Diarrhea and Other (See Comments) 11/27/2019    Family History  Problem Relation Age of Onset  . Colon cancer Father      Review of Systems: 12 system ROS is negative except as noted above with the addition of cough, hearing problems, and lower extremity edema.   Physical Exam: General:   Alert,  well-nourished, pleasant and cooperative in NAD, examined in a wheelchair, hard of hearing wearing hearing aids Head:  Normocephalic and atraumatic. Eyes:  Sclera clear, no icterus.   Conjunctiva pink. Ears:  Normal auditory acuity. Nose:  No deformity, discharge,  or lesions. Mouth:  No deformity or lesions.   Neck:  Supple; no masses or thyromegaly. Lungs:  Clear throughout to auscultation.   No wheezes. Heart:  Regular rate and rhythm; no murmurs. Abdomen:  Soft, nontender, nondistended, normal bowel sounds, no rebound or guarding. No hepatosplenomegaly.   Rectal:  Deferred  Msk:  Symmetrical. No boney deformities LAD:  No inguinal or umbilical LAD Extremities:  No clubbing or edema. Neurologic:  Alert and  oriented x4;  grossly nonfocal, although minimally interactive without direct stimulation Skin:  Intact without significant lesions or rashes. Psych:  Alert and cooperative. Normal mood and affect.     Kimberly L. Orvan Falconer, MD, MPH 08/06/2020, 2:07 PM

## 2020-08-06 NOTE — Patient Instructions (Addendum)
It was a pleasure to meet you today. Based on our discussion, I am providing you with my recommendations below:  RECOMMENDATION(S):   I am recommending labs to better evaluate your symptoms. In addition, I would like for you to see Nutrition so they can assist with nutritional options to address your food intolerances. Finally, I would like for you to trial Over the Counter Pepto-Bismol as written below.  OVER THE COUNTER MEDICATION(S):   Please purchase the following medications over the counter and take as directed:   Pepto-Bismol - please take 30 mL or two tablets 3 times daily  LABS:   . Please proceed to the basement level for lab work before leaving today. Press "B" on the elevator. The lab is located at the first door on the left as you exit the elevator.  HEALTHCARE LAWS AND MY CHART RESULTS:   . Due to recent changes in healthcare laws, you may see results of your imaging and/or laboratory studies on MyChart before I have had a chance to review them.  I understand that in some cases there may be results that are confusing or concerning to you. Please understand that not all results are received at same time and often I may need to interpret multiple results in order to provide you with the best plan of care or course of treatment. Therefore, I ask that you please give me 48 hours to thoroughly review all your results before contacting my office for clarification.   REFERRAL:    You will receive a call from [facility] about your appointment date, time and location.  BMI:  . If you are age 82 or older, your body mass index should be between 23-30. Your There is no height or weight on file to calculate BMI. If this is out of the aforementioned range listed, please consider follow up with your Primary Care Provider.  Thank you for trusting me with your gastrointestinal care!    Tressia Danas, MD, MPH

## 2020-08-06 NOTE — Progress Notes (Signed)
Called Joycelyn Man, NP's office [phone]) to obtain most recent lab results per Dr. Orvan Falconer request. States they will fax.

## 2020-08-07 LAB — IGA: Immunoglobulin A: 134 mg/dL (ref 70–320)

## 2020-08-07 LAB — TISSUE TRANSGLUTAMINASE, IGA: (tTG) Ab, IgA: <1 U/mL

## 2020-08-07 NOTE — Progress Notes (Signed)
SECOND ATTEMPT:  Called Kelli Hope, NP's office and spoke with Lillia Abed. Advised about the attempt made yesterday to request labs and that we were informed they would faxed. However, lab results had not been received. Asked if she could please fax these labs again. Provided with our fax #.

## 2020-08-10 LAB — ALLERGEN PROFILE, FOOD-MEAT
Beef IgE: <0.1 kU/L
Chicken IgE: <0.1 kU/L
Pork IgE: <0.1 kU/L

## 2020-08-10 NOTE — Progress Notes (Signed)
Labs received from Kelli Hope, NP's office and placed on Dr. Orvan Falconer desk for her to review.

## 2020-08-11 ENCOUNTER — Telehealth: Payer: Self-pay

## 2020-08-11 NOTE — Telephone Encounter (Signed)
-----   Message from Tressia Danas, MD sent at 08/10/2020  3:58 PM EST ----- Please schedule follow-up with me or an APP in 2-3 weeks. Thanks.  KLB

## 2020-08-11 NOTE — Telephone Encounter (Signed)
Pt scheduled to see Alcide Evener NP 09/11/20@11am . Appt letter mailed to pt.

## 2020-09-11 ENCOUNTER — Ambulatory Visit: Payer: Medicare Other | Admitting: Nurse Practitioner

## 2021-03-19 ENCOUNTER — Encounter (HOSPITAL_COMMUNITY): Payer: Self-pay | Admitting: Emergency Medicine

## 2021-03-19 ENCOUNTER — Other Ambulatory Visit: Payer: Self-pay

## 2021-03-19 ENCOUNTER — Emergency Department (HOSPITAL_COMMUNITY)
Admission: EM | Admit: 2021-03-19 | Discharge: 2021-03-20 | Disposition: A | Discharge status: Home / Self Care | Payer: Medicare Other | Attending: Emergency Medicine | Admitting: Emergency Medicine

## 2021-03-19 DIAGNOSIS — R531 Weakness: Secondary | ICD-10-CM | POA: Diagnosis not present

## 2021-03-19 DIAGNOSIS — F0150 Vascular dementia without behavioral disturbance: Secondary | ICD-10-CM | POA: Diagnosis not present

## 2021-03-19 DIAGNOSIS — M6250 Muscle wasting and atrophy, not elsewhere classified, unspecified site: Secondary | ICD-10-CM | POA: Insufficient documentation

## 2021-03-19 DIAGNOSIS — Z91010 Allergy to peanuts: Secondary | ICD-10-CM | POA: Insufficient documentation

## 2021-03-19 DIAGNOSIS — Z79899 Other long term (current) drug therapy: Secondary | ICD-10-CM | POA: Diagnosis not present

## 2021-03-19 DIAGNOSIS — Z7982 Long term (current) use of aspirin: Secondary | ICD-10-CM | POA: Diagnosis not present

## 2021-03-19 DIAGNOSIS — E039 Hypothyroidism, unspecified: Secondary | ICD-10-CM | POA: Diagnosis not present

## 2021-03-19 DIAGNOSIS — R002 Palpitations: Secondary | ICD-10-CM | POA: Diagnosis not present

## 2021-03-19 NOTE — ED Provider Notes (Signed)
Emergency Medicine Provider Triage Evaluation Note  Paul Hopkins , a 87 y.o. male  was evaluated in triage.  Pt complains of palpitations for 3 hours taht have now resolved.  He endorses no other associated symptoms.  He states he was watching TV when the palpitations occurred.  He states that it felt like his heart was beating quickly.  He did not feel lightheadedness or dizziness or chest pain or shortness of breath.  He states that he is a resident of [facility] nursing home.  He was brought here for the symptoms he states that they resolved since he was brought to the ER.  Feels entirely well at this time.  No other associate symptoms.  States he is not on blood thinners and I do not see any in his past medical history.  Review of Systems  Positive: Palpitations Negative: Fever  Physical Exam  BP 127/74 (BP Location: Left Arm)   Pulse 60   Temp 98.7 F (37.1 C) (Oral)   Resp 15   Ht 5\' 10"  (1.778 m)   Wt 80 kg   SpO2 98%   BMI 25.31 kg/m  Gen:   Awake, no distress   Resp:  Normal effort  MSK:   Moves extremities without difficulty  Other:  RRR, no murmurs rubs or gallops  Medical Decision Making  Medically screening exam initiated at 10:47 PM.  Appropriate orders placed.  was informed that the remainder of the evaluation will be completed by another provider, this initial triage assessment does not replace that evaluation, and the importance of remaining in the ED until their evaluation is complete.  Abnormal EKG I have no comparison Palpitations today that are now resolved.  No symptoms associate with palpitations and no symptoms currently.  Will check labs including TSH and mag Aberrant conduction on EKG however does appear to have sinus rhythm with P waves.  No ST with T wave abnormalities of note.  This may be patient's baseline EKG however I do not have any comparison to validate this with.   Shaune Pollack, [facility]en Shelter 03/19/21 2250     05/19/21, DO 03/20/21 0040

## 2021-03-19 NOTE — ED Triage Notes (Signed)
Patient arrived with EMS from [facility] nursing home reports palpitations this evening , no chest pain , denies SOB .

## 2021-03-20 DIAGNOSIS — R002 Palpitations: Secondary | ICD-10-CM | POA: Diagnosis not present

## 2021-03-20 LAB — CBC WITH DIFFERENTIAL/PLATELET
Abs Immature Granulocytes: 0.01 10*3/uL (ref 0.00–0.07)
Basophils Absolute: 0.1 10*3/uL (ref 0.0–0.1)
Basophils Relative: 1 %
Eosinophils Absolute: 0.3 10*3/uL (ref 0.0–0.5)
Eosinophils Relative: 4 %
HCT: 43.9 % (ref 39.0–52.0)
Hemoglobin: 14.2 g/dL (ref 13.0–17.0)
Immature Granulocytes: 0 %
Lymphocytes Relative: 21 %
Lymphs Abs: 1.3 10*3/uL (ref 0.7–4.0)
MCH: 30.8 pg (ref 26.0–34.0)
MCHC: 32.3 g/dL (ref 30.0–36.0)
MCV: 95.2 fL (ref 80.0–100.0)
Monocytes Absolute: 0.6 10*3/uL (ref 0.1–1.0)
Monocytes Relative: 10 %
Neutro Abs: 3.8 10*3/uL (ref 1.7–7.7)
Neutrophils Relative %: 64 %
Platelets: 229 10*3/uL (ref 150–400)
RBC: 4.61 MIL/uL (ref 4.22–5.81)
RDW: 13.4 % (ref 11.5–15.5)
WBC: 6.1 10*3/uL (ref 4.0–10.5)
nRBC: 0 % (ref 0.0–0.2)

## 2021-03-20 LAB — COMPREHENSIVE METABOLIC PANEL
ALT: 12 U/L (ref 0–44)
AST: 23 U/L (ref 15–41)
Albumin: 3.8 g/dL (ref 3.5–5.0)
Alkaline Phosphatase: 73 U/L (ref 38–126)
Anion gap: 8 (ref 5–15)
BUN: 18 mg/dL (ref 8–23)
CO2: 26 mmol/L (ref 22–32)
Calcium: 9.5 mg/dL (ref 8.9–10.3)
Chloride: 104 mmol/L (ref 98–111)
Creatinine, Ser: 0.8 mg/dL (ref 0.61–1.24)
GFR, Estimated: >60 mL/min (ref >60)
Glucose, Bld: 122 mg/dL — ABNORMAL HIGH (ref 70–99)
Potassium: 4.3 mmol/L (ref 3.5–5.1)
Sodium: 138 mmol/L (ref 135–145)
Total Bilirubin: 0.9 mg/dL (ref 0.3–1.2)
Total Protein: 6.3 g/dL — ABNORMAL LOW (ref 6.5–8.1)

## 2021-03-20 LAB — MAGNESIUM: Magnesium: 2.1 mg/dL (ref 1.7–2.4)

## 2021-03-20 LAB — TROPONIN I (HIGH SENSITIVITY)
Troponin I (High Sensitivity): 7 ng/L (ref <18)
Troponin I (High Sensitivity): 7 ng/L (ref <18)

## 2021-03-20 LAB — TSH: TSH: 2.143 u[IU]/mL (ref 0.350–4.500)

## 2021-03-20 NOTE — ED Provider Notes (Signed)
[facility] EMERGENCY DEPARTMENT Provider Note   CSN: [id number] Arrival date & time: 03/19/21  2128     History Chief Complaint  Patient presents with   Palpitations    Paul Hopkins is a 87 y.o. male.  HPI Patient presents from local nursing facility with concern for palpitations.  Patient has dementia, but seems to answer questions about his current status appropriately.  However, patient cannot provide any details of longer term history, nor precipitating, alleviating factors.  Patient denies pain, dyspnea, nausea.  He is here with his daughter who assists with the history.  Patient has a history of stroke, vascular dementia, hypothyroidism.  Reportedly he has been taking his medication as directed, with no recent changes.  Yesterday, just prior to ED arrival patient described palpitations to staff members at his facility.  He was sent here for evaluation.  Daughter notes the patient appears in a normal fashion to her.    Past Medical History:  Diagnosis Date   Dementia (HCC)    Hypothyroidism    Stroke [facility])     Patient Active Problem List   Diagnosis Date Noted   Pseudomonas infection 11/28/2019   Vascular dementia (HCC) 11/28/2019   Hypothyroidism (acquired) 11/28/2019   Palliative care by specialist    Goals of care, counseling/discussion    DNR (do not resuscitate)    Sepsis (HCC) 11/27/2019    Past Surgical History:  Procedure Laterality Date   EXCISIONAL HEMORRHOIDECTOMY         Family History  Problem Relation Age of Onset   Colon cancer Father     Social History   Tobacco Use   Smoking status: Never   Smokeless tobacco: Never  Vaping Use   Vaping Use: Never used  Substance Use Topics   Alcohol use: Never   Drug use: Never    Home Medications Prior to Admission medications   Medication Sig Start Date End Date Taking? Authorizing Provider  ARIPiprazole (ABILIFY) 10 MG tablet Take 10 mg by mouth daily.    [provider]  aspirin EC 81 MG tablet Take 81 mg by mouth daily. Swallow whole.    [provider]  Digestive Enzyme CAPS Take 1 capsule by mouth See admin instructions. Take 1 capsule by mouth at 5 PM daily    [provider]  levothyroxine (SYNTHROID) 88 MCG tablet Take 88 mcg by mouth daily before breakfast.    [provider]  SODIUM FLUORIDE PO 30 mLs by Mouth Rinse route in the morning.    [provider]    Allergies    Amoxicillin, Banana, Bean pod extract, Blackberry [rubus fruticosus], Carrot oil, Ciprofloxacin, Citric acid, Citrus, Corn-containing products, Egg [eggs or egg-derived products], Milk-related compounds, Other, Peanut-containing drug products, Raspberry, Strawberry extract, Sulfa antibiotics, Trimethoprim, and Whey  Review of Systems   Review of Systems  Unable to perform ROS: Dementia   Physical Exam Updated Vital Signs BP 139/66   Pulse (!) 45   Temp 97.7 F (36.5 C) (Oral)   Resp 11   Ht 5\' 10"  (1.778 m)   Wt 80 kg   SpO2 100%   BMI 25.31 kg/m   Physical Exam Vitals and nursing note reviewed.  Constitutional:      General: He is not in acute distress.    Appearance: He is well-developed. He is not ill-appearing or toxic-appearing.     Comments: Sickly appearing elderly male in no distress.  HENT:  Head: Normocephalic and atraumatic.  Eyes:     Conjunctiva/sclera: Conjunctivae normal.  Cardiovascular:     Rate and Rhythm: Normal rate and regular rhythm.     Comments: Occasional ectopic pulses Pulmonary:     Effort: Pulmonary effort is normal. No respiratory distress.     Breath sounds: No stridor.  Abdominal:     General: There is no distension.  Skin:    General: Skin is warm and dry.     Coloration: Skin is pale.  Neurological:     Mental Status: He is alert.     Motor: Weakness and atrophy present.    ED Results / Procedures / Treatments   Labs (all labs ordered are listed, but only abnormal  results are displayed) Labs Reviewed  COMPREHENSIVE METABOLIC PANEL - Abnormal; Notable for the following components:      Result Value   Glucose, Bld 122 (*)    Total Protein 6.3 (*)    All other components within normal limits  CBC WITH DIFFERENTIAL/PLATELET  TSH  MAGNESIUM  TROPONIN I (HIGH SENSITIVITY)  TROPONIN I (HIGH SENSITIVITY)    EKG EKG Interpretation  Date/Time:  Friday March 19 2021 22:21:52 EDT Ventricular Rate:  64 PR Interval:  176 QRS Duration: 162 QT Interval:  448 QTC Calculation: 462 R Axis:   -72 Text Interpretation: Sinus rhythm with Premature atrial complexes Right bundle branch block Left anterior fascicular block Bifascicular block Minimal voltage criteria for LVH, may be normal variant ( R in aVL ) Possible Lateral infarct , age undetermined Abnormal ECG Confirmed by Drema Pry [phone]) on 03/19/2021 11:30:01 PM  Radiology No results found.  Procedures Procedures   Medications Ordered in ED Medications - No data to display  Cardiac monitor 50s, sinus with PAC, abnormal Pulse ox 98% room air normal In comparison to prior EKG and a cardiac monitor the patient has a similar pattern going back to 2018 with interventricular conduction delay, frequent PAC 1:08 PM On repeat exam patient is in no distress, awake, alert, speaking minimally, as before.  No new changes, on monitor he remains about the same.  Daughter and I discussed all findings again, reassuring results, 2 normal troponin, nonischemic EKG without changes from 2018.  Some suspicion for PACs contributing to mild dysrhythmia contributing to his description of palpitations.  Here no evidence for ACS, sustained arrhythmia, or other acute new phenomena.  We discussed admission given unclear circumstances of yesterday's event, and the patient's dementia, versus discharge to his facility with outpatient follow-up and return precautions.  Daughter amenable to this latter plan. ED Course  I have  reviewed the triage vital signs and the nursing notes.  Pertinent labs & imaging results that were available during my care of the patient were reviewed by me and considered in my medical decision making (see chart for details).   Final Clinical Impression(s) / ED Diagnoses Final diagnoses:  Palpitations    Rx / DC Orders ED Discharge Orders     None        Gerhard Munch, MD 03/20/21 1309

## 2021-03-20 NOTE — Discharge Instructions (Addendum)
As discussed, your evaluation today has been largely reassuring.  But, it is important that you monitor your condition carefully, and do not hesitate to return to the ED if you develop new, or concerning changes in your condition. ? ?Otherwise, please follow-up with your physician for appropriate ongoing care. ? ?

## 2021-03-20 NOTE — ED Notes (Signed)
Reviewed discharge instructions with patient and daughter. Follow-up care reviewed. Patient and daughter verbalized understanding. Patient A&Ox4, VSS upon discharge. 

## 2021-03-20 NOTE — ED Notes (Signed)
Daughter will be transporting pt to [place]

## 2021-03-20 NOTE — ED Notes (Signed)
Attempted report to facility x 4

## 2021-03-20 NOTE — ED Notes (Signed)
Attempted to give report to facility x3

## 2021-04-16 ENCOUNTER — Telehealth: Payer: Self-pay

## 2021-04-16 NOTE — Telephone Encounter (Signed)
NOTES SCANNED TO REFRRAL 

## 2021-05-19 ENCOUNTER — Ambulatory Visit (INDEPENDENT_AMBULATORY_CARE_PROVIDER_SITE_OTHER): Payer: Medicare Other | Admitting: Cardiovascular Disease

## 2021-05-19 ENCOUNTER — Other Ambulatory Visit: Payer: Self-pay

## 2021-05-19 ENCOUNTER — Encounter: Payer: Self-pay | Admitting: Cardiovascular Disease

## 2021-05-19 DIAGNOSIS — R001 Bradycardia, unspecified: Secondary | ICD-10-CM | POA: Insufficient documentation

## 2021-05-19 NOTE — Progress Notes (Signed)
Cardiology Office Note:    Date:  05/19/2021   ID:  Paul Hopkins, DOB 12/29/1933, MRN [id number]  PCP:  Paul Man, NP   [facility] Providers Cardiologist:  None {   Referring MD: Paul Hopkins*   Chief Complaint  Patient presents with   Bradycardia     History of Present Illness:    Paul Hopkins is a 87 y.o. male with a hx of bradycardia.  We are asked to see him today by Paul Hopkins for further evaluation of his bradycardia  Seen with daughter, Paul Hopkins  Pt went to the ER with symptoms of racing heart  ECG in er showed normal sinus rhythm at 64.  RBBB   Lives in independent living,  has home care who come in to assist .  Sleepy all the time  Has meniers disease - causes him to be dizzy on occasion   Has vertigo symptoms   His resting HR here in the wheelchair was 44 We had him stand up , sit down, and repeated this ~ 7-8 times ( we did not have his waker to walk him around the office )  His HR increased to 68 with this activity .   Past Medical History:  Diagnosis Date   Bradycardia    Delusional disorder (HCC)    Dementia (HCC)    Hypothyroidism    Hypothyroidism    Meniere disease    Non-pressure chronic ulcer of buttock (HCC)    Osteoarthritis    Stroke (HCC)    Vascular dementia (HCC)     Past Surgical History:  Procedure Laterality Date   EXCISIONAL HEMORRHOIDECTOMY      Current Medications: Current Meds  Medication Sig   ARIPiprazole (ABILIFY) 10 MG tablet Take 10 mg by mouth daily.   Ascorbic Acid (VITAMIN C PO) Take by mouth.   aspirin EC 81 MG tablet Take 81 mg by mouth daily. Swallow whole.   carbamide peroxide (DEBROX) 6.5 % OTIC solution 5 drops 2 (two) times daily.   Digestive Enzyme CAPS Take 1 capsule by mouth See admin instructions. Take 1 capsule by mouth at 5 PM daily   levothyroxine (SYNTHROID) 88 MCG tablet Take 88 mcg by mouth daily before breakfast.   Multiple Vitamin (MULTI-VITAMIN  PO) Take by mouth.   SODIUM FLUORIDE PO 30 mLs by Mouth Rinse route in the morning.   TRIAMCINOLONE ACETONIDE, TOP, 0.05 % OINT Apply topically.   VITAMIN D PO Take by mouth.     Allergies:   Amoxicillin, Banana, Bean pod extract, Blackberry [rubus fruticosus], Carrot oil, Ciprofloxacin, Citric acid, Citrus, Corn-containing products, Egg [eggs or egg-derived products], Milk-related compounds, Other, Peanut-containing drug products, Raspberry, Strawberry extract, Sulfa antibiotics, Trimethoprim, and Whey   Social History   Socioeconomic History   Marital status: Widowed    Spouse name: Not on file   Number of children: Not on file   Years of education: Not on file   Highest education level: Not on file  Occupational History   Occupation: Retired Art gallery manager   Tobacco Use   Smoking status: Never   Smokeless tobacco: Never  Vaping Use   Vaping Use: Never used  Substance and Sexual Activity   Alcohol use: Never   Drug use: Never   Sexual activity: Not Currently  Other Topics Concern   Not on file  Social History Narrative   Lives as SNF   Divorced   Daughter- Paul Hopkins    Social Determinants of Health  Financial Resource Strain: Not on file  Food Insecurity: Not on file  Transportation Needs: Not on file  Physical Activity: Not on file  Stress: Not on file  Social Connections: Not on file     Family History: The patient's family history includes Colon cancer in his father.  ROS:   Please see the history of present illness.     All other systems reviewed and are negative.  EKGs/Labs/Other Studies Reviewed:       EKG:   Oct. 10, 2022:   NSR at 64,  occasional PAC.  RBBB    Recent Labs: 03/19/2021: ALT 12; BUN 18; Creatinine, Ser 0.80; Hemoglobin 14.2; Magnesium 2.1; Platelets 229; Potassium 4.3; Sodium 138; TSH 2.143  Recent Lipid Panel No results found for: CHOL, TRIG, HDL, CHOLHDL, VLDL, LDLCALC, LDLDIRECT   Risk Assessment/Calculations:           Physical  Exam:    VS:  BP (!) 112/58 (BP Location: Right Arm, Patient Position: Sitting, Cuff Size: Normal)   Pulse (!) 59   Ht 5\' 10"  (1.778 m)   Wt 148 lb 3.2 oz (67.2 kg)   SpO2 99%   BMI 21.26 kg/m     Wt Readings from Last 3 Encounters:  05/19/21 148 lb 3.2 oz (67.2 kg)  03/19/21 176 lb 5.9 oz (80 kg)  08/06/20 160 lb (72.6 kg)     GEN: eldelry , frail Hopkins  acute distress,  examined in the wheelchair.   He sat slumped over in the chair throughout the exam and discussion HEENT: Normal NECK: No JVD; No carotid bruits LYMPHATICS: No lymphadenopathy CARDIAC: RRR, no murmurs, rubs, gallops RESPIRATORY:  Clear to auscultation without rales, wheezing or rhonchi  ABDOMEN: Soft, non-tender, non-distended MUSCULOSKELETAL:  No edema; No deformity  SKIN: Warm and dry NEUROLOGIC:  Alert and oriented x 3 PSYCHIATRIC:  Normal affect   ASSESSMENT:    No diagnosis found. PLAN:    In order of problems listed above:  Sinus bradycardia: Paul Hopkins presents with sinus bradycardia.  We performed a very mild exercise routine involving standing up and sitting back down approximate separate times.  With this his heart rate increased from 44-68. It appears that his chronotropic response is intact.  I do not think that there is any indication for pacemaker.  He is not had any episodes of syncope or presyncope.  He is clearly slowing down but he has at least moderate amount of dementia.  The patient is concerned-and the family is also concerned about him falling.  He has Mnire's disease and has lots of dizziness.  I do not think that his slow heart rate is necessarily contributing to his dizziness.  I would suspect that his heart rate increases to the 60s or 70s when he is up ambulating.  We will have him return in 1 year for follow-up visit.  I have asked the family to call Paul Hopkins if he has any problems in the meantime.           Medication Adjustments/Labs and Tests Ordered: Current medicines are  reviewed at length with the patient today.  Concerns regarding medicines are outlined above.  No orders of the defined types were placed in this encounter.  No orders of the defined types were placed in this encounter.   There are no Patient Instructions on file for this visit.   Signed, [place], MD  05/19/2021 1:13 PM    [facility]

## 2021-05-19 NOTE — Patient Instructions (Signed)
Medication Instructions:  NO CHANGES *If you need a refill on your cardiac medications before your next appointment, please call your pharmacy*   Lab Work: NONE If you have labs (blood work) drawn today and your tests are completely normal, you will receive your results only by: MyChart Message (if you have MyChart) OR A paper copy in the mail If you have any lab test that is abnormal or we need to change your treatment, we will call you to review the results.   Testing/Procedures: NONE   Follow-Up: At [facility], you and your health needs are our priority.  As part of our continuing mission to provide you with exceptional heart care, we have created designated Provider Care Teams.  These Care Teams include your primary Cardiologist (physician) and Advanced Practice Providers (APPs -  Physician Assistants and Nurse Practitioners) who all work together to provide you with the care you need, when you need it.  We recommend signing up for the patient portal called "MyChart".  Sign up information is provided on this After Visit Summary.  MyChart is used to connect with patients for Virtual Visits (Telemedicine).  Patients are able to view lab/test results, encounter notes, upcoming appointments, etc.  Non-urgent messages can be sent to your provider as well.   To learn more about what you can do with MyChart, go to [email or web address].    Your next appointment:   12 month(s)  The format for your next appointment:   In Person  Provider:   APP OR DR [facility] NONE    Other Instructions NONE

## 2021-08-05 IMAGING — DX DG CHEST 1V PORT
1 series · 1 of 1 positions shown · non-contrast
Comparison: Chest radiograph 11/25/2019

CLINICAL DATA: Weakness, lethargy, decreased communication.

EXAM:
PORTABLE CHEST 1 VIEW

[chest ap]
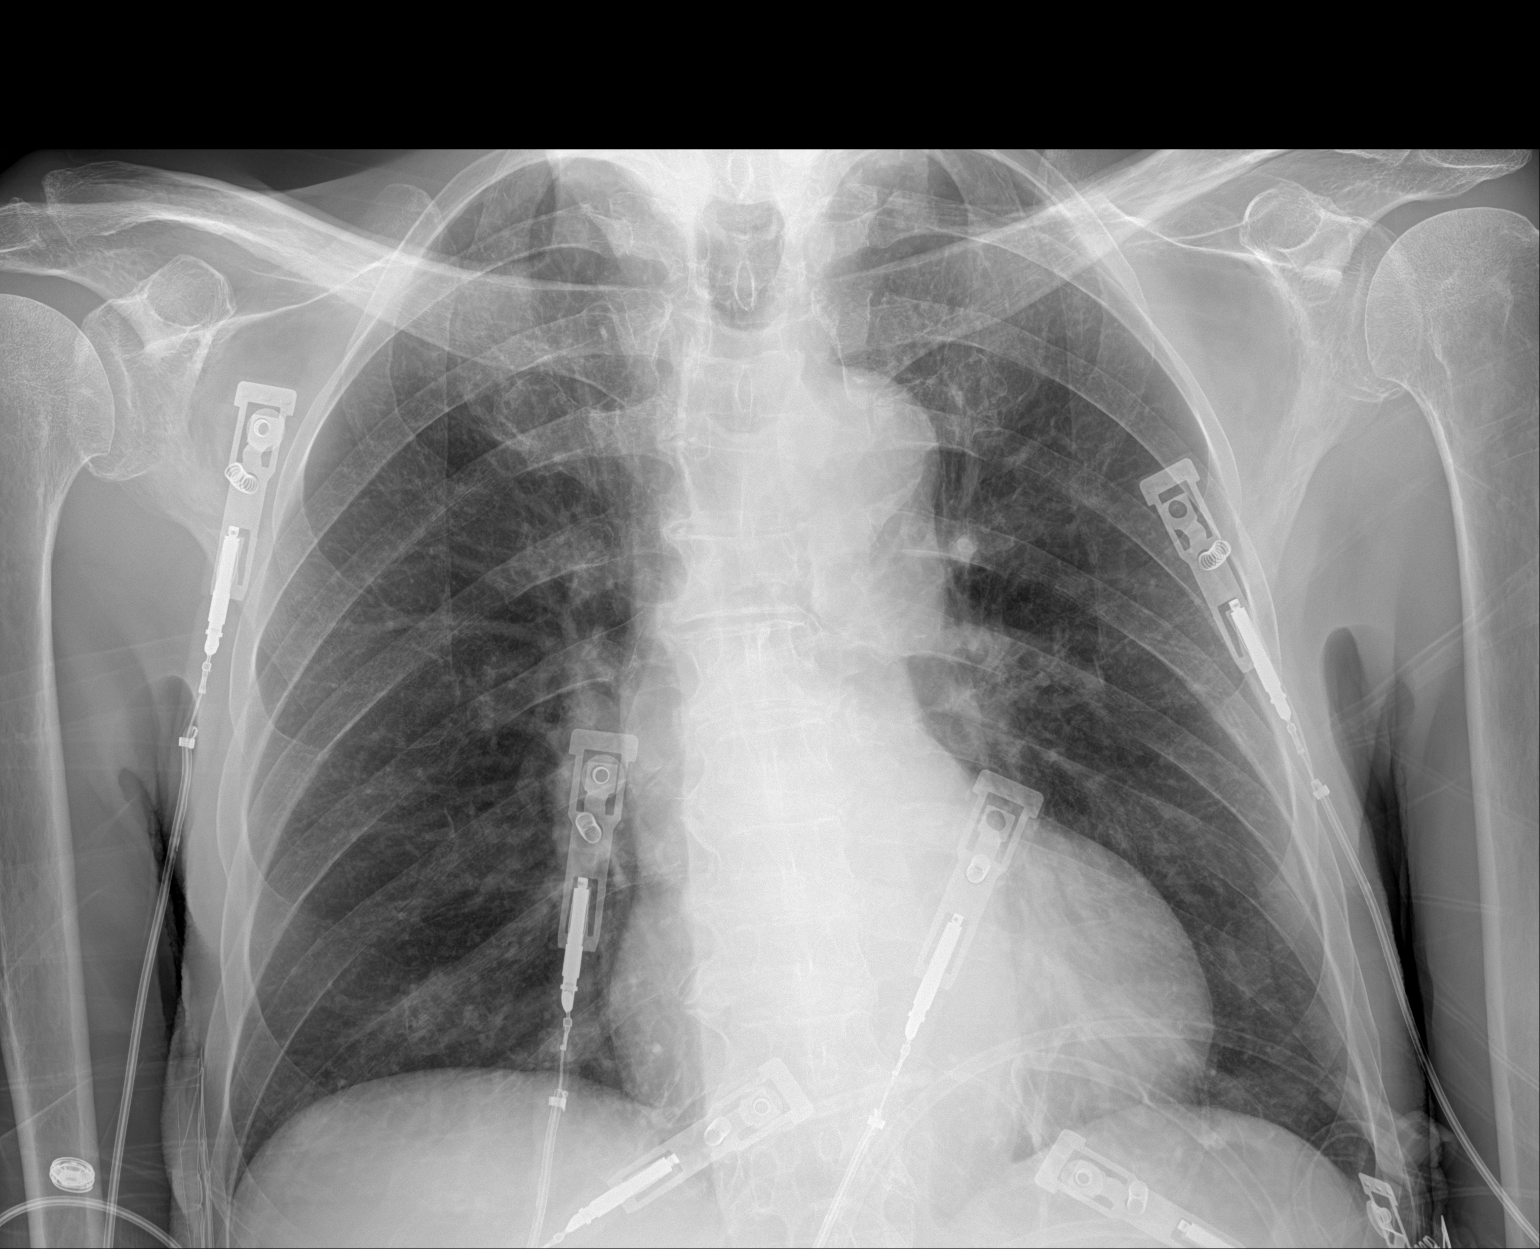

[1 of 1 positions shown; findings below may reference images not displayed]

FINDINGS: Stable cardiomediastinal contours. The lungs are clear. No
pneumothorax or significant pleural effusion. No acute finding in
the visualized skeleton.
IMPRESSION: No acute cardiopulmonary process.

## 2021-08-05 IMAGING — US US ABDOMEN LIMITED
1 series · 14 of 25 positions shown · non-contrast
Comparison: CT abdomen and pelvis 11/27/2019

CLINICAL DATA: Periportal edema and pericholecystic fluid on CT.

EXAM:
ULTRASOUND ABDOMEN LIMITED RIGHT UPPER QUADRANT

[Series 1: us abdomen limited ruq · 14 of 35 slices shown]
[im 1/35]
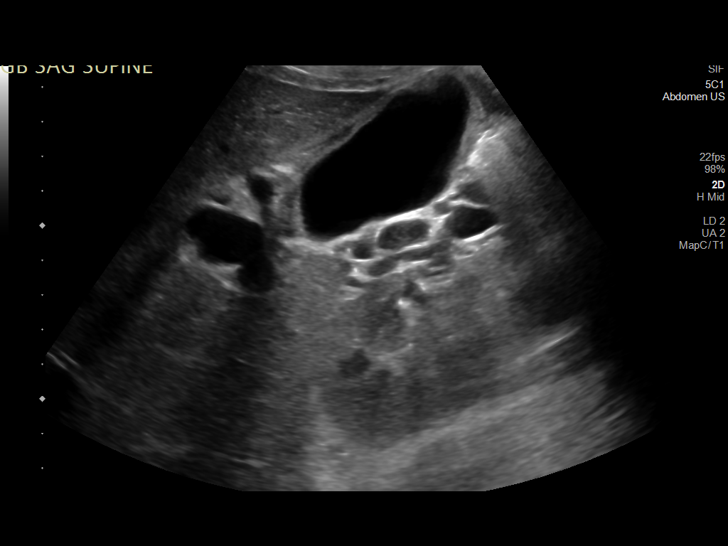
[im 3/35]
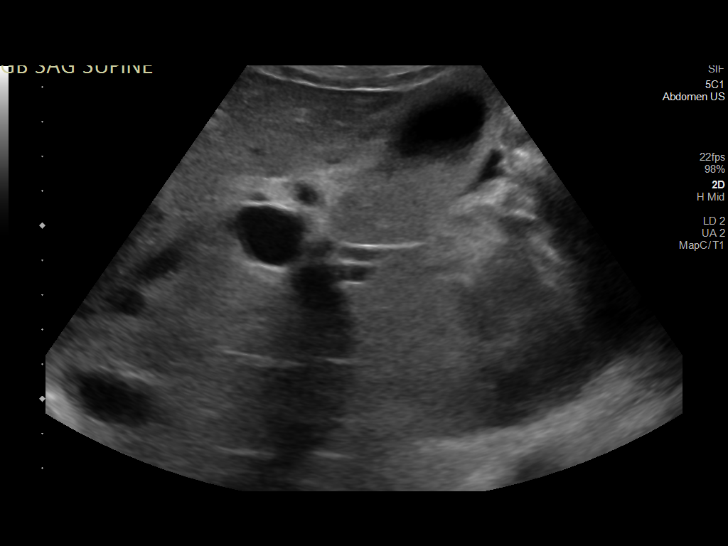
[im 6/35]
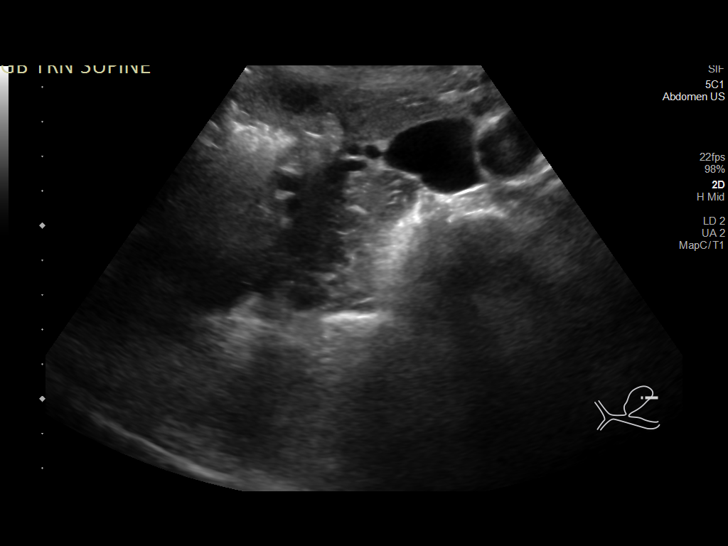
[im 9/35]
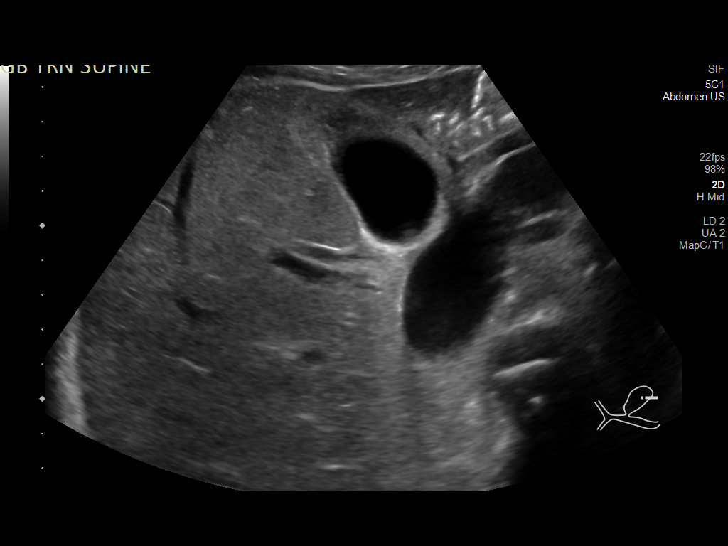
[im 12/35]
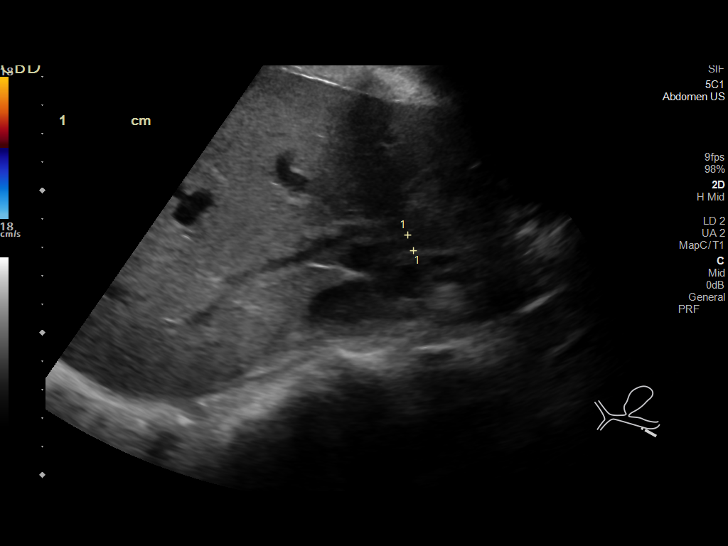
[im 13/35]
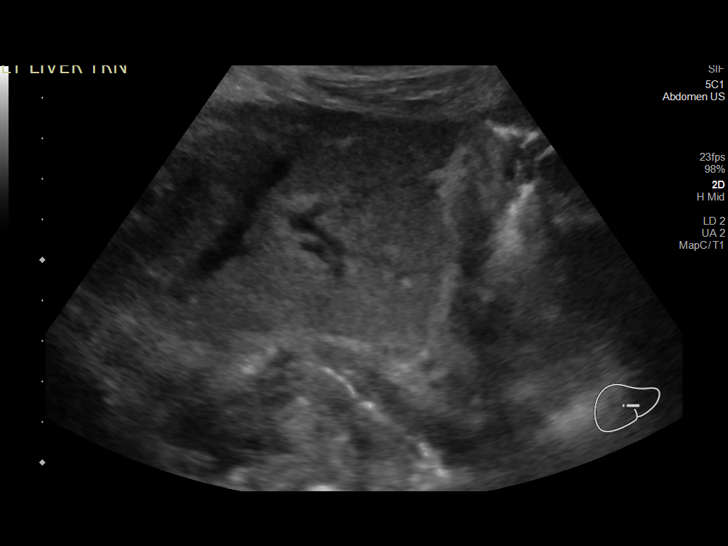
[im 16/35]
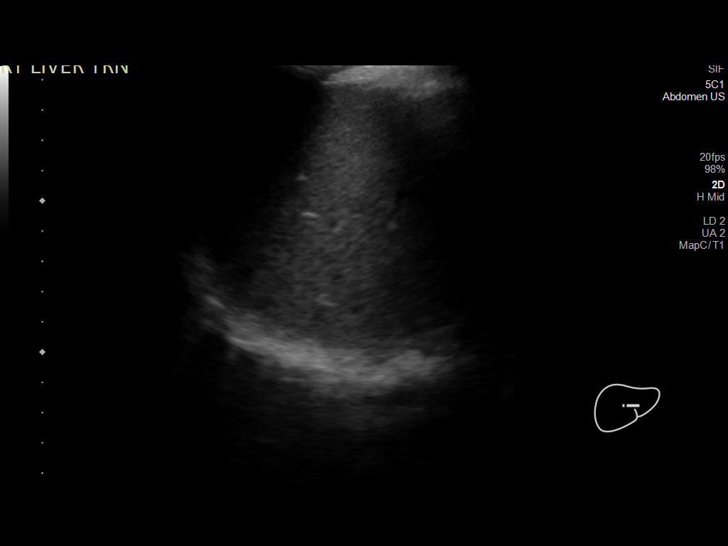
[im 19/35]
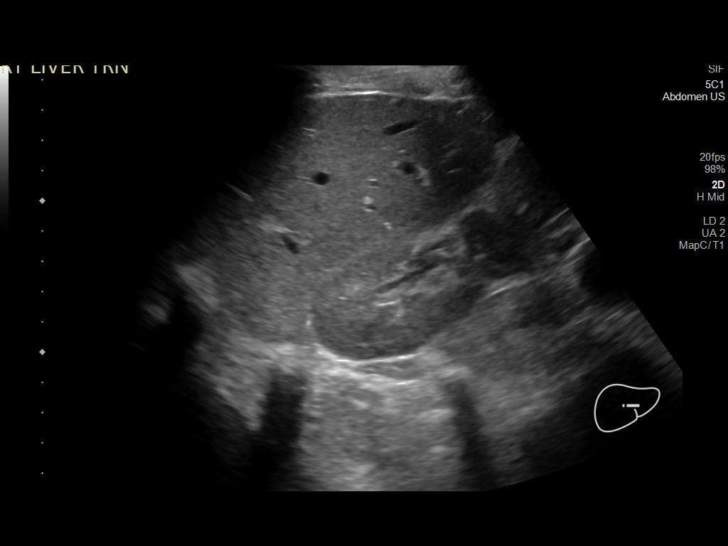
[im 22/35]
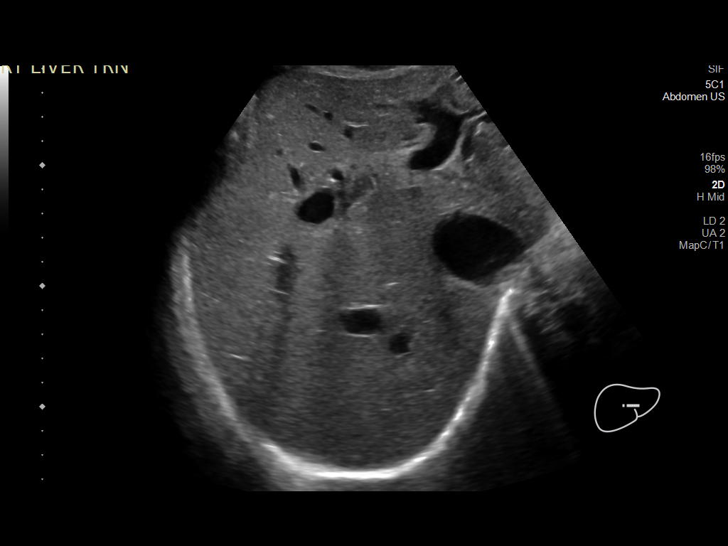
[im 23/35]
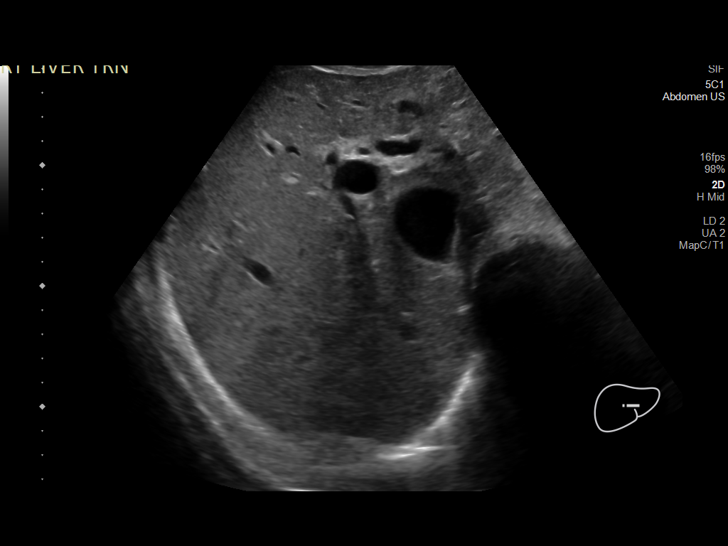
[im 26/35]
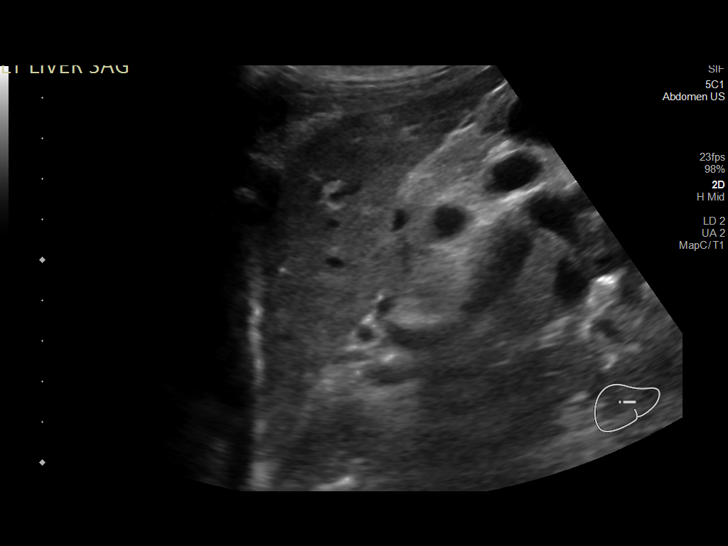
[im 29/35]
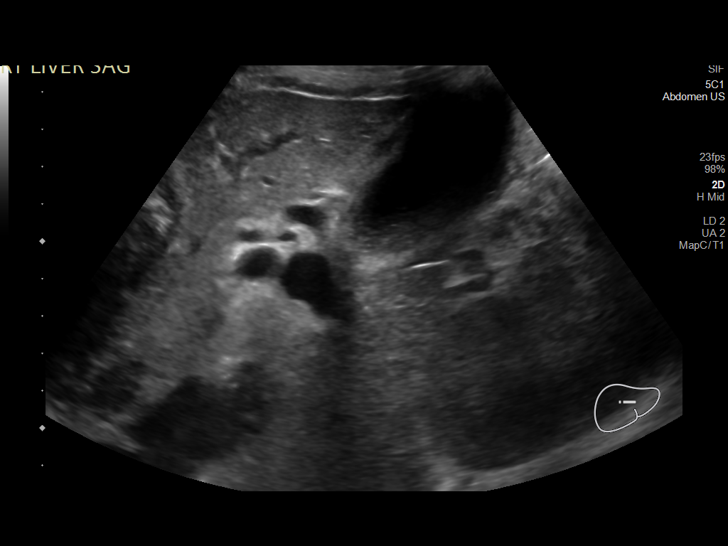
[im 32/35]
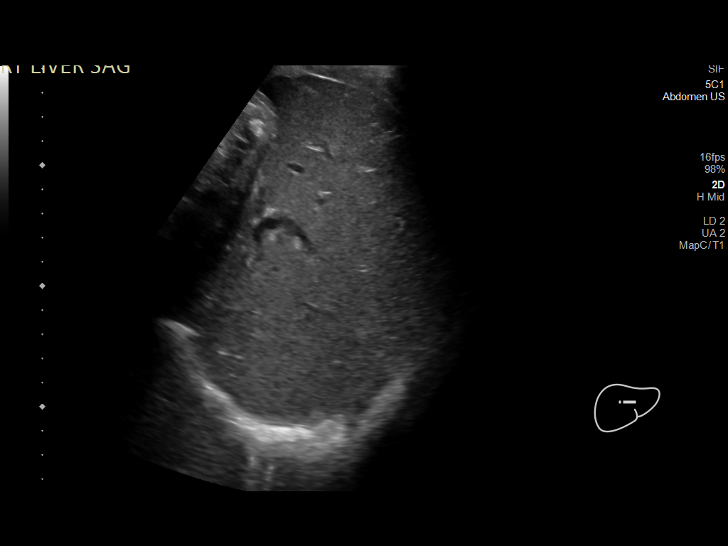
[im 35/35]
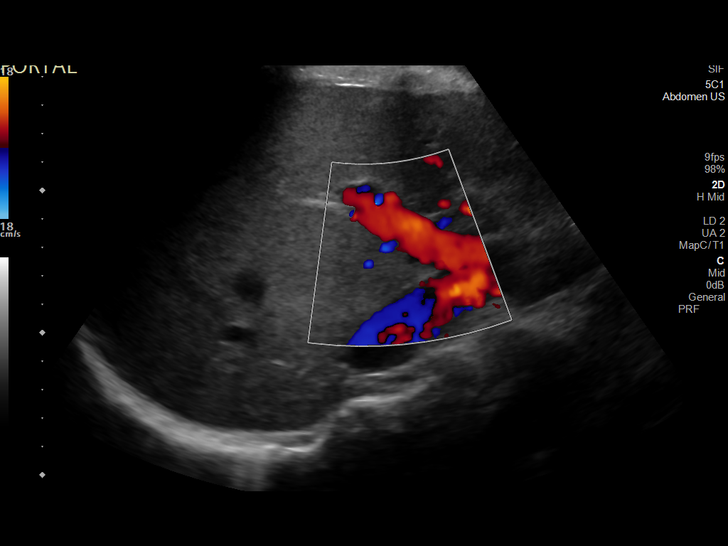

[14 of 25 positions shown; findings below may reference images not displayed]

FINDINGS: Gallbladder:

No gallstones or wall thickening visualized. Trace pericholecystic
fluid. No sonographic Murphy sign noted by sonographer.

Common bile duct:

Diameter: 6 mm

Liver:

No focal lesion identified. Within normal limits in parenchymal
echogenicity. Portal vein is patent on color Doppler imaging with
normal direction of blood flow towards the liver.

Other: Small volume right upper quadrant ascites.
IMPRESSION: 1. Small volume ascites in the right upper quadrant including trace
pericholecystic fluid.
2. No gallstones, biliary dilatation, or focal liver abnormality
identified.

## 2021-10-22 IMAGING — CT CT ANGIO NECK
2 of 7 series · 8 of 33 positions shown · IV contrast (omnipaque)
Comparison: CT head 02/13/2020

CLINICAL DATA: Acute neuro deficit.

EXAM:
CT ANGIOGRAPHY HEAD AND NECK
TECHNIQUE: Multidetector CT imaging of the head and neck was performed using
the standard protocol during bolus administration of intravenous
contrast. Multiplanar CT image reconstructions and MIPs were
obtained to evaluate the vascular anatomy. Carotid stenosis
measurements (when applicable) are obtained utilizing NASCET
criteria, using the distal internal carotid diameter as the
denominator.
CONTRAST:  60mL OMNIPAQUE IOHEXOL 350 MG/ML SOLN

[Series 5: cta neck · axial · 0.48mm/px · z∈[-307,-189]mm · 2 of 178 slices shown]
[im 60/178  soft-tissue]
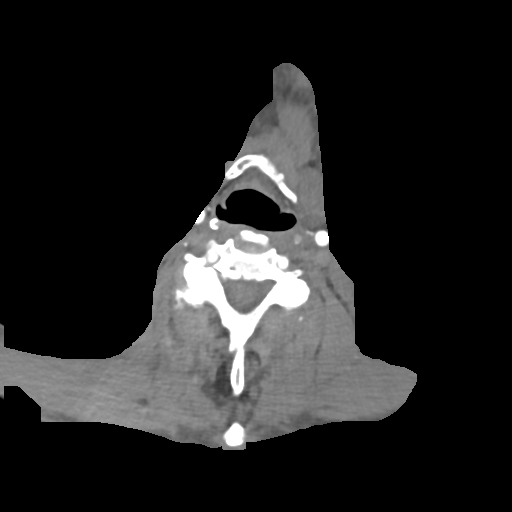
[im 119/178  soft-tissue]
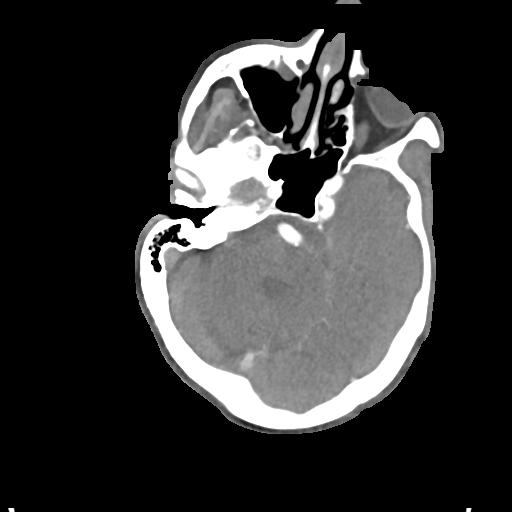

[Series 7: cta neck axial · axial · 0.45mm/px · z∈[-374,-121]mm · 6 of 355 slices shown]
[im 51/355  soft-tissue]
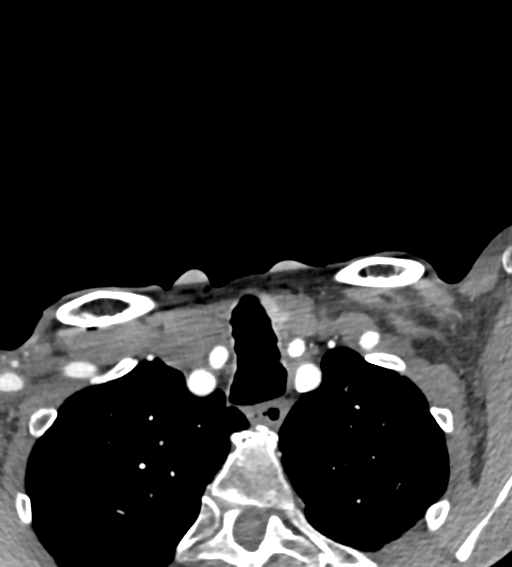
[im 102/355  bone]
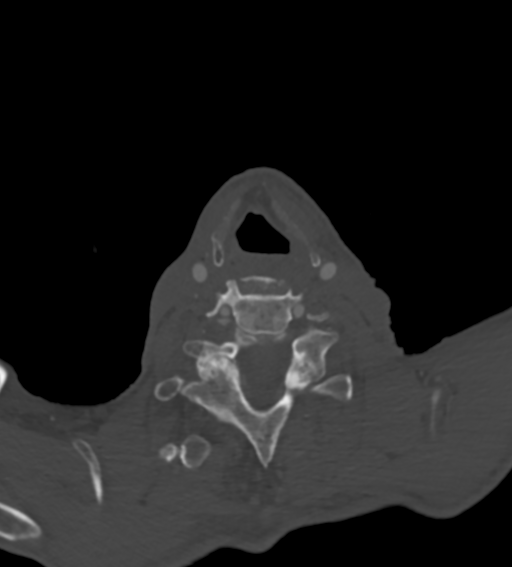
[im 152/355  soft-tissue]
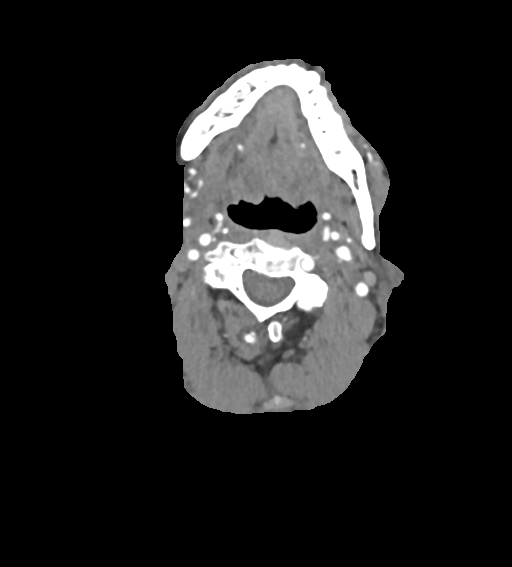
[im 203/355  bone]
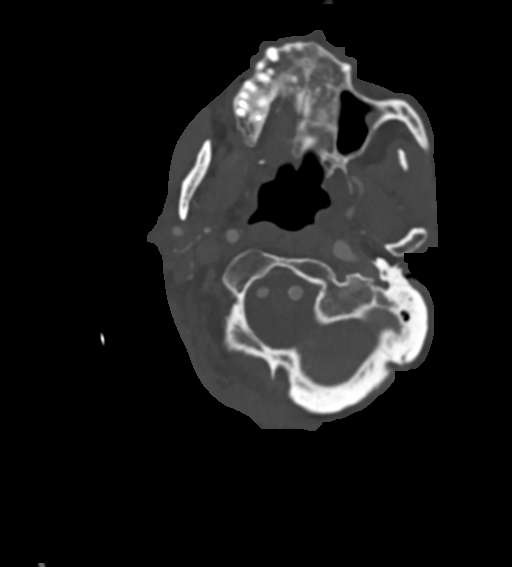
[im 253/355  soft-tissue]
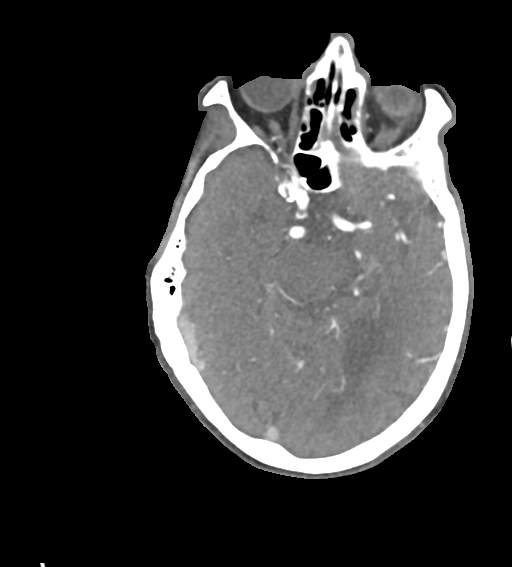
[im 304/355  bone]
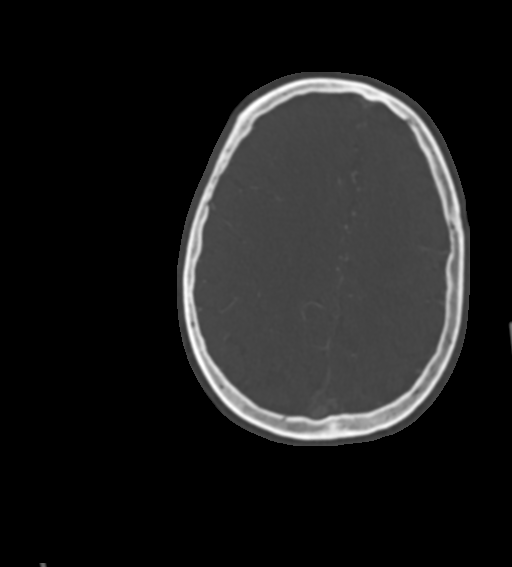

[8 of 33 positions shown; findings below may reference images not displayed]

FINDINGS: CTA NECK FINDINGS

Aortic arch: Standard branching. Imaged portion shows no evidence of
aneurysm or dissection. No significant stenosis of the major arch
vessel origins. Tortuosity and mild atherosclerotic disease in the
aortic arch.

Right carotid system: Right carotid widely patent without stenosis
or atherosclerotic disease.

Left carotid system: Left carotid widely patent without
atherosclerotic disease or stenosis.

Vertebral arteries: Both vertebral arteries patent to the basilar
without stenosis

Skeleton: Cervical spondylosis.  No acute skeletal abnormality.

Other neck: Negative for mass or adenopathy in the neck.

Upper chest: Visualized lung apices clear bilaterally without acute
abnormality.

Review of the MIP images confirms the above findings

CTA HEAD FINDINGS

Anterior circulation: Mild atherosclerotic calcification in the
cavernous carotid bilaterally without significant stenosis. Anterior
and middle cerebral arteries patent bilaterally without stenosis.

Posterior circulation: Atherosclerotic calcification in the
vertebral artery at the skull base bilaterally without significant
stenosis. Both vertebral arteries patent to the basilar. Left
vertebral artery dominant. PICA patent bilaterally. Basilar is
tortuous but widely patent. Superior cerebellar and posterior
cerebral arteries patent bilaterally without stenosis or large
vessel occlusion.

Venous sinuses: Normal venous enhancement

Anatomic variants: None

Review of the MIP images confirms the above findings
IMPRESSION: 1. Negative for intracranial large vessel occlusion.
2. Negative for carotid or vertebral artery stenosis in the neck.
3. Mild atherosclerotic disease in the cavernous carotid bilaterally
and the distal vertebral artery bilaterally without significant
stenosis.

## 2021-10-27 ENCOUNTER — Encounter (HOSPITAL_BASED_OUTPATIENT_CLINIC_OR_DEPARTMENT_OTHER): Payer: Self-pay

## 2021-10-27 ENCOUNTER — Emergency Department (HOSPITAL_BASED_OUTPATIENT_CLINIC_OR_DEPARTMENT_OTHER): Payer: Medicare Other

## 2021-10-27 ENCOUNTER — Emergency Department (HOSPITAL_BASED_OUTPATIENT_CLINIC_OR_DEPARTMENT_OTHER)
Admission: EM | Admit: 2021-10-27 | Discharge: 2021-10-28 | Disposition: A | Discharge status: Home / Self Care | Payer: Medicare Other | Attending: Emergency Medicine | Admitting: Emergency Medicine

## 2021-10-27 ENCOUNTER — Other Ambulatory Visit: Payer: Self-pay

## 2021-10-27 DIAGNOSIS — E039 Hypothyroidism, unspecified: Secondary | ICD-10-CM | POA: Insufficient documentation

## 2021-10-27 DIAGNOSIS — Z91010 Allergy to peanuts: Secondary | ICD-10-CM | POA: Diagnosis not present

## 2021-10-27 DIAGNOSIS — Z79899 Other long term (current) drug therapy: Secondary | ICD-10-CM | POA: Insufficient documentation

## 2021-10-27 DIAGNOSIS — R531 Weakness: Secondary | ICD-10-CM | POA: Insufficient documentation

## 2021-10-27 DIAGNOSIS — Z7982 Long term (current) use of aspirin: Secondary | ICD-10-CM | POA: Diagnosis not present

## 2021-10-27 DIAGNOSIS — R2681 Unsteadiness on feet: Secondary | ICD-10-CM

## 2021-10-27 LAB — URINALYSIS, ROUTINE W REFLEX MICROSCOPIC
Bilirubin Urine: NEGATIVE
Glucose, UA: NEGATIVE mg/dL
Hgb urine dipstick: NEGATIVE
Ketones, ur: NEGATIVE mg/dL
Leukocytes,Ua: NEGATIVE
Nitrite: NEGATIVE
Protein, ur: NEGATIVE mg/dL
Specific Gravity, Urine: 1.01 (ref 1.005–1.030)
pH: 7 (ref 5.0–8.0)

## 2021-10-27 LAB — BASIC METABOLIC PANEL
Anion gap: 9 (ref 5–15)
BUN: 14 mg/dL (ref 8–23)
CO2: 26 mmol/L (ref 22–32)
Calcium: 9 mg/dL (ref 8.9–10.3)
Chloride: 103 mmol/L (ref 98–111)
Creatinine, Ser: 0.86 mg/dL (ref 0.61–1.24)
GFR, Estimated: >60 mL/min (ref >60)
Glucose, Bld: 100 mg/dL — ABNORMAL HIGH (ref 70–99)
Potassium: 4.3 mmol/L (ref 3.5–5.1)
Sodium: 138 mmol/L (ref 135–145)

## 2021-10-27 LAB — CBC WITH DIFFERENTIAL/PLATELET
Abs Immature Granulocytes: 0.02 10*3/uL (ref 0.00–0.07)
Basophils Absolute: 0 10*3/uL (ref 0.0–0.1)
Basophils Relative: 1 %
Eosinophils Absolute: 0.1 10*3/uL (ref 0.0–0.5)
Eosinophils Relative: 1 %
HCT: 42 % (ref 39.0–52.0)
Hemoglobin: 14.1 g/dL (ref 13.0–17.0)
Immature Granulocytes: 0 %
Lymphocytes Relative: 18 %
Lymphs Abs: 1.3 10*3/uL (ref 0.7–4.0)
MCH: 30.9 pg (ref 26.0–34.0)
MCHC: 33.6 g/dL (ref 30.0–36.0)
MCV: 91.9 fL (ref 80.0–100.0)
Monocytes Absolute: 0.7 10*3/uL (ref 0.1–1.0)
Monocytes Relative: 9 %
Neutro Abs: 5.1 10*3/uL (ref 1.7–7.7)
Neutrophils Relative %: 71 %
Platelets: 189 10*3/uL (ref 150–400)
RBC: 4.57 MIL/uL (ref 4.22–5.81)
RDW: 13 % (ref 11.5–15.5)
WBC: 7.2 10*3/uL (ref 4.0–10.5)
nRBC: 0 % (ref 0.0–0.2)

## 2021-10-27 LAB — TROPONIN I (HIGH SENSITIVITY): Troponin I (High Sensitivity): 10 ng/L (ref <18)

## 2021-10-27 NOTE — ED Provider Notes (Signed)
?[facility] EMERGENCY DEPT ?Provider Note ? ? ?CSN: [id number] ?Arrival date & time: 10/27/21  1928 ? ?  ? ?History ? ?Chief Complaint  ?Patient presents with  ? Fatigue  ? Urinary Frequency  ? ? ?Paul Hopkins is a 88 y.o. male. ? ?Patient presents to ER chief complaint of generalized weakness and altered gait.  Family states that he typically leans to the left side but today he has had worsening gait increased instability and leaning to the opposite side now.  Otherwise the patient uses a walker at baseline and wheelchair when going longer distances.  He himself complains of no pain no headache no chest pain no abdominal pain.  No reports of fevers or cough or vomiting or diarrhea.  Family concerned he might be just a urinary tract infection but did not quite sure and brought him to the ER to be evaluated. ? ? ?  ? ?Home Medications ?Prior to Admission medications   ?Medication Sig Start Date End Date Taking? Authorizing Provider  ?ARIPiprazole (ABILIFY) 10 MG tablet Take 10 mg by mouth daily.    [provider]  ?Ascorbic Acid (VITAMIN C PO) Take by mouth.    [provider]  ?aspirin EC 81 MG tablet Take 81 mg by mouth daily. Swallow whole.    [provider]  ?benzonatate (TESSALON) 100 MG capsule Take by mouth 3 (three) times daily as needed for cough. ?Patient not taking: Reported on 05/19/2021    [provider]  ?carbamide peroxide (DEBROX) 6.5 % OTIC solution 5 drops 2 (two) times daily.    [provider]  ?ciclopirox (PENLAC) 8 % solution Apply topically. ?Patient not taking: Reported on 05/19/2021 04/16/21   [provider]  ?Digestive Enzyme CAPS Take 1 capsule by mouth See admin instructions. Take 1 capsule by mouth at 5 PM daily    [provider]  ?levothyroxine (SYNTHROID) 88 MCG tablet Take 88 mcg by mouth daily before breakfast.    [provider]  ?Multiple Vitamin (MULTI-VITAMIN PO) Take by mouth.     [provider]  ?nystatin (MYCOSTATIN/NYSTOP) powder Apply 1 application topically 3 (three) times daily. ?Patient not taking: Reported on 05/19/2021    [provider]  ?SODIUM FLUORIDE PO 30 mLs by Mouth Rinse route in the morning.    [provider]  ?TRIAMCINOLONE ACETONIDE, TOP, 0.05 % OINT Apply topically.    [provider]  ?VITAMIN D PO Take by mouth.    [provider]  ?   ? ?Allergies    ?Amoxicillin, Banana, Bean pod extract, Blackberry [rubus fruticosus], Carrot oil, Ciprofloxacin, Citric acid, Citrus, Corn-containing products, Egg [eggs or egg-derived products], Milk-related compounds, Other, Peanut-containing drug products, Raspberry, Strawberry extract, Sulfa antibiotics, Trimethoprim, and Whey   ? ?Review of Systems   ?Review of Systems  ?Constitutional:  Negative for fever.  ?HENT:  Negative for ear pain and sore throat.   ?Eyes:  Negative for pain.  ?Respiratory:  Negative for cough.   ?Cardiovascular:  Negative for chest pain.  ?Gastrointestinal:  Negative for abdominal pain.  ?Genitourinary:  Negative for flank pain.  ?Musculoskeletal:  Negative for back pain.  ?Skin:  Negative for color change and rash.  ?Neurological:  Negative for syncope.  ?All other systems reviewed and are negative. ? ?Physical Exam ?Updated Vital Signs ?BP (!) 149/61   Pulse 60   Temp 98.7 ?F (37.1 ?C)   Resp 16   Ht 5\' 9"  (1.753 m)   Wt  68 kg   SpO2 98%   BMI 22.15 kg/m?  ?Physical Exam ?Constitutional:   ?   Appearance: He is well-developed.  ?HENT:  ?   Head: Normocephalic.  ?   Nose: Nose normal.  ?Eyes:  ?   Extraocular Movements: Extraocular movements intact.  ?Cardiovascular:  ?   Rate and Rhythm: Normal rate.  ?Pulmonary:  ?   Effort: Pulmonary effort is normal.  ?Skin: ?   Coloration: Skin is not jaundiced.  ?Neurological:  ?   Mental Status: He is alert.  ?   Comments: Patient is able to stand and weight-bear but has a very unsteady gait.  He states he feels  more wobbly than normal despite using hand-held assistance.  No focal neurodeficit noted otherwise.  No facial droop noted otherwise.  ? ? ?ED Results / Procedures / Treatments   ?Labs ?(all labs ordered are listed, but only abnormal results are displayed) ?Labs Reviewed  ?BASIC METABOLIC PANEL - Abnormal; Notable for the following components:  ?    Result Value  ? Glucose, Bld 100 (*)   ? All other components within normal limits  ?URINE CULTURE  ?URINALYSIS, ROUTINE W REFLEX MICROSCOPIC  ?CBC WITH DIFFERENTIAL/PLATELET  ?TROPONIN I (HIGH SENSITIVITY)  ?TROPONIN I (HIGH SENSITIVITY)  ? ? ?EKG ?None ? ?Radiology ?CT Head Wo Contrast ? ?Result Date: 10/27/2021 ?CLINICAL DATA:  Neurologic deficit. EXAM: CT HEAD WITHOUT CONTRAST TECHNIQUE: Contiguous axial images were obtained from the base of the skull through the vertex without intravenous contrast. RADIATION DOSE REDUCTION: This exam was performed according to the departmental dose-optimization program which includes automated exposure control, adjustment of the mA and/or kV according to patient size and/or use of iterative reconstruction technique. COMPARISON:  Head CT dated 02/13/2020. FINDINGS: Brain: Mild age-related atrophy and chronic microvascular ischemic changes. There is no acute intracranial hemorrhage. No mass effect or midline shift. No extra-axial fluid collection. Vascular: No hyperdense vessel or unexpected calcification. Skull: Normal. Negative for fracture or focal lesion. Sinuses/Orbits: No acute finding. Other: None IMPRESSION: 1. No acute intracranial pathology. 2. Mild age-related atrophy and chronic microvascular ischemic changes. Electronically Signed   By: Elgie Collard M.D.   On: 10/27/2021 23:53   ? ?Procedures ?Procedures  ? ? ?Medications Ordered in ED ?Medications - No data to display ? ?ED Course/ Medical Decision Making/ A&P ?  ?                        ?Medical Decision Making ?Amount and/or Complexity of Data Reviewed ?Labs:  ordered. ?Radiology: ordered. ? ? ?History obtained from daughter at bedside. ? ?Cardiac monitoring shows sinus rhythm. ? ?Work-up was otherwise unremarkable urinalysis negative CBC chemistry normal troponin negative.  CT scan of the brain shows no acute findings. ? ?Patient to be transferred to [facility] for MRI imaging for rule out CVA. ? ? ? ? ? ? ? ?Final Clinical Impression(s) / ED Diagnoses ?Final diagnoses:  ?Unsteady gait  ? ? ?Rx / DC Orders ?ED Discharge Orders   ? ? None  ? ?  ? ? ?  ?Cheryll Cockayne, MD ?10/28/21 0000 ? ?

## 2021-10-27 NOTE — ED Triage Notes (Addendum)
Pt presents to the ED with daughter from [facility]. Presents for urinary frequency, fatigue, and "leaning to the left side instead of usual right". Daughter reports that the last time he had symptoms like this he had a UTI. Pt has a hx of dementia and orientation is at baseline per daughter. Pt appears to be sitting straight at time of triage. Strength and sensation equal bilaterally. Daughter reports pt to normally have a lower HR. No deficits on exam ?

## 2021-10-28 ENCOUNTER — Emergency Department (HOSPITAL_COMMUNITY): Payer: Medicare Other

## 2021-10-28 DIAGNOSIS — Z79899 Other long term (current) drug therapy: Secondary | ICD-10-CM | POA: Diagnosis not present

## 2021-10-28 DIAGNOSIS — R531 Weakness: Secondary | ICD-10-CM | POA: Diagnosis not present

## 2021-10-28 DIAGNOSIS — E039 Hypothyroidism, unspecified: Secondary | ICD-10-CM | POA: Diagnosis not present

## 2021-10-28 DIAGNOSIS — R2681 Unsteadiness on feet: Secondary | ICD-10-CM | POA: Diagnosis present

## 2021-10-28 DIAGNOSIS — Z7982 Long term (current) use of aspirin: Secondary | ICD-10-CM | POA: Diagnosis not present

## 2021-10-28 DIAGNOSIS — Z91010 Allergy to peanuts: Secondary | ICD-10-CM | POA: Diagnosis not present

## 2021-10-28 LAB — TROPONIN I (HIGH SENSITIVITY): Troponin I (High Sensitivity): 11 ng/L (ref <18)

## 2021-10-28 NOTE — ED Notes (Signed)
[facility] at bedside for transport.  

## 2021-10-28 NOTE — ED Notes (Signed)
[facility] contacted for transport 

## 2021-10-28 NOTE — Discharge Instructions (Signed)
You were evaluated in the Emergency Department and after careful evaluation, we did not find any emergent condition requiring admission or further testing in the hospital.  Your exam/testing today is overall reassuring.  MRI did not show evidence of stroke.  Recommend follow-up with primary care doctor to discuss your symptoms.  Please return to the Emergency Department if you experience any worsening of your condition.   Thank you for allowing [place] to be a part of your care.

## 2021-10-28 NOTE — ED Notes (Signed)
Patient to MRI.

## 2021-10-28 NOTE — ED Notes (Addendum)
Patient's daughter requests to be called with any updates/results.

## 2021-10-28 NOTE — ED Provider Notes (Signed)
  Provider Note MRN:  [id number]  Arrival date & time: 10/28/21    ED Course and Medical Decision Making  Assumed care from Dr. Audley Hose upon patient transfer.  Transfer for MRI for unstable gait, rule out stroke.  Patient is well-appearing on my assessment, MRI is negative, appropriate for discharge.  Procedures  Final Clinical Impressions(s) / ED Diagnoses     ICD-10-CM   1. Unsteady gait  R26.81       ED Discharge Orders     None       Discharge Instructions   None     Elmer Sow. Pilar Plate, MD [facility] Emergency Medicine [facility] [email or web address]    Sabas Sous, MD 10/28/21 [phone]

## 2021-10-28 NOTE — ED Notes (Addendum)
Patients IV removed. Site clean, dry and intact.

## 2021-10-28 NOTE — ED Triage Notes (Addendum)
Patient BIB [facility] from [facility] for MRI.  Patient denies complaints at this time.  Patient appears to be sitting up straight, strength equal bilaterally, no drift noted.

## 2021-10-28 NOTE — ED Notes (Addendum)
Report called to [facility] Annice Pih). Patient's daughter called, no answer x 1.  Left HIPAA compliant voicemail.

## 2021-10-29 LAB — URINE CULTURE

## 2022-04-28 ENCOUNTER — Ambulatory Visit (INDEPENDENT_AMBULATORY_CARE_PROVIDER_SITE_OTHER): Payer: Medicare Other | Admitting: Podiatry

## 2022-04-28 DIAGNOSIS — M79675 Pain in left toe(s): Secondary | ICD-10-CM

## 2022-04-28 DIAGNOSIS — M79674 Pain in right toe(s): Secondary | ICD-10-CM | POA: Diagnosis not present

## 2022-04-28 DIAGNOSIS — B351 Tinea unguium: Secondary | ICD-10-CM | POA: Diagnosis not present

## 2022-04-28 NOTE — Progress Notes (Signed)
  Subjective:  Patient ID: Paul Hopkins, male    DOB: 1934-03-08,  MRN: [id number]  Chief Complaint  Patient presents with   Nail Problem    RFC    88 y.o. male presents with the above complaint. History confirmed with patient. Patient presenting with pain related to dystrophic thickened elongated nails. Patient is unable to trim own nails related to nail dystrophy and/or mobility issues. Patient does not have a history of T2DM. Marland Kitchen   Objective:  Physical Exam: warm, good capillary refill nail exam onychomycosis of the toenails, onycholysis, and dystrophic nails DP pulses palpable, PT pulses palpable, and protective sensation intact Left Foot:  Pain with palpation of nails due to elongation and dystrophic growth.  Right Foot: Pain with palpation of nails due to elongation and dystrophic growth.   Assessment:   1. Pain due to onychomycosis of toenails of both feet      Plan:  Patient was evaluated and treated and all questions answered.   #Onychomycosis with pain  -Nails palliatively debrided as below. -Educated on self-care  Procedure: Nail Debridement Rationale: Pain Type of Debridement: manual, sharp debridement. Instrumentation: Nail nipper, rotary burr. Number of Nails: 10  Return if symptoms worsen or fail to improve, for RFC.         Corinna Gab, DPM [facility] / [facility]

## 2022-07-28 ENCOUNTER — Ambulatory Visit (INDEPENDENT_AMBULATORY_CARE_PROVIDER_SITE_OTHER): Payer: Medicare Other | Admitting: Podiatry

## 2022-07-28 DIAGNOSIS — B351 Tinea unguium: Secondary | ICD-10-CM | POA: Diagnosis not present

## 2022-07-28 DIAGNOSIS — M79675 Pain in left toe(s): Secondary | ICD-10-CM

## 2022-07-28 DIAGNOSIS — M79674 Pain in right toe(s): Secondary | ICD-10-CM | POA: Diagnosis not present

## 2022-07-28 NOTE — Progress Notes (Signed)
  Subjective:  Patient ID: Paul Hopkins, male    DOB: 1933/10/02,  MRN: [id number]  Chief Complaint  Patient presents with   Nail Problem    RFC    89 y.o. male presents with the above complaint. History confirmed with patient. Patient presenting with pain related to dystrophic thickened elongated nails. Patient is unable to trim own nails related to nail dystrophy and/or mobility issues. Patient does not have a history of T2DM. Marland Kitchen   Objective:  Physical Exam: warm, good capillary refill nail exam onychomycosis of the toenails, onycholysis, and dystrophic nails DP pulses palpable, PT pulses palpable, and protective sensation intact Left Foot:  Pain with palpation of nails due to elongation and dystrophic growth.  Right Foot: Pain with palpation of nails due to elongation and dystrophic growth.   Assessment:   1. Pain due to onychomycosis of toenails of both feet       Plan:  Patient was evaluated and treated and all questions answered.   #Onychomycosis with pain  -Nails palliatively debrided as below. -Educated on self-care  Procedure: Nail Debridement Rationale: Pain Type of Debridement: manual, sharp debridement. Instrumentation: Nail nipper, rotary burr. Number of Nails: 10  Return in about 3 months (around 10/26/2022) for RFC.         Everitt Amber, DPM [facility] / [facility]

## 2022-10-13 ENCOUNTER — Ambulatory Visit (INDEPENDENT_AMBULATORY_CARE_PROVIDER_SITE_OTHER): Payer: Medicare Other | Admitting: Podiatry

## 2022-10-13 DIAGNOSIS — B351 Tinea unguium: Secondary | ICD-10-CM

## 2022-10-13 DIAGNOSIS — M79674 Pain in right toe(s): Secondary | ICD-10-CM | POA: Diagnosis not present

## 2022-10-13 DIAGNOSIS — M79675 Pain in left toe(s): Secondary | ICD-10-CM

## 2022-10-13 NOTE — Progress Notes (Signed)
  Subjective:  Patient ID: Paul Hopkins, male    DOB: [birth year],  MRN: [id number]  Chief Complaint  Patient presents with   Nail Problem    RFC     89 y.o. male presents with the above complaint. History confirmed with patient. Patient presenting with pain related to dystrophic thickened elongated nails. Patient is unable to trim own nails related to nail dystrophy and/or mobility issues. Patient does not have a history of T2DM.  Objective:  Physical Exam: warm, good capillary refill nail exam onychomycosis of the toenails, onycholysis, and dystrophic nails DP pulses palpable, PT pulses palpable, and protective sensation intact Left Foot:  Pain with palpation of nails due to elongation and dystrophic growth.  Right Foot: Pain with palpation of nails due to elongation and dystrophic growth.   Assessment:   1. Pain due to onychomycosis of toenails of both feet     Plan:  Patient was evaluated and treated and all questions answered.   #Onychomycosis with pain  -Nails palliatively debrided as below. -Educated on self-care  Procedure: Nail Debridement Rationale: Pain Type of Debridement: manual, sharp debridement. Instrumentation: Nail nipper, rotary burr. Number of Nails: 10  Return in about 3 months (around 01/13/2023) for [facility].         Corinna Gab, DPM [facility] / [facility]

## 2022-12-05 ENCOUNTER — Telehealth: Payer: Self-pay | Admitting: Podiatry

## 2022-12-05 NOTE — Telephone Encounter (Signed)
LVM for patient to call back to reschedule.

## 2022-12-30 ENCOUNTER — Ambulatory Visit: Payer: Medicare Other | Admitting: Podiatry

## 2023-01-06 ENCOUNTER — Ambulatory Visit (INDEPENDENT_AMBULATORY_CARE_PROVIDER_SITE_OTHER): Payer: Medicare Other | Admitting: Podiatry

## 2023-01-06 DIAGNOSIS — B351 Tinea unguium: Secondary | ICD-10-CM

## 2023-01-06 DIAGNOSIS — M79675 Pain in left toe(s): Secondary | ICD-10-CM

## 2023-01-06 DIAGNOSIS — M79674 Pain in right toe(s): Secondary | ICD-10-CM | POA: Diagnosis not present

## 2023-01-06 NOTE — Progress Notes (Signed)
  Subjective:  Patient ID: Paul Hopkins, male    DOB: August 22, 1933,  MRN: [id number]  Chief Complaint  Patient presents with   Nail Problem    RFC Nail Trim    89 y.o. male presents with the above complaint. History confirmed with patient. Patient presenting with pain related to dystrophic thickened elongated nails. Patient is unable to trim own nails related to nail dystrophy and/or mobility issues. Patient does not have a history of T2DM.  Objective:  Physical Exam: warm, good capillary refill nail exam onychomycosis of the toenails, onycholysis, and dystrophic nails DP pulses palpable, PT pulses palpable, and protective sensation intact Left Foot:  Pain with palpation of nails due to elongation and dystrophic growth.  Right Foot: Pain with palpation of nails due to elongation and dystrophic growth.   Assessment:   1. Pain due to onychomycosis of toenails of both feet      Plan:  Patient was evaluated and treated and all questions answered.   #Onychomycosis with pain  -Nails palliatively debrided as below. -Educated on self-care  Procedure: Nail Debridement Rationale: Pain Type of Debridement: manual, sharp debridement. Instrumentation: Nail nipper, rotary burr. Number of Nails: 10  Return in about 3 months (around 04/08/2023) for RFC.         Corinna Gab, DPM [facility] / [facility]

## 2023-01-12 ENCOUNTER — Ambulatory Visit: Payer: Medicare Other | Admitting: Podiatry

## 2023-03-30 ENCOUNTER — Ambulatory Visit: Payer: Medicare Other | Admitting: Podiatry

## 2023-03-30 ENCOUNTER — Encounter: Payer: Self-pay | Admitting: Podiatry

## 2023-03-30 DIAGNOSIS — M79674 Pain in right toe(s): Secondary | ICD-10-CM

## 2023-03-30 DIAGNOSIS — M79675 Pain in left toe(s): Secondary | ICD-10-CM | POA: Diagnosis not present

## 2023-03-30 DIAGNOSIS — B351 Tinea unguium: Secondary | ICD-10-CM

## 2023-03-30 NOTE — Progress Notes (Signed)
  Subjective:  Patient ID: Paul Hopkins, male    DOB: 04/20/1934,  MRN: [id number]  Chief Complaint  Patient presents with   Routine Foot Care    Onychomycosis    89 y.o. male presents with the above complaint. History confirmed with patient. Patient presenting with pain related to dystrophic thickened elongated nails. Patient is unable to trim own nails related to nail dystrophy and/or mobility issues. Patient does not have a history of T2DM.  Objective:  Physical Exam: warm, good capillary refill nail exam onychomycosis of the toenails, onycholysis, and dystrophic nails DP pulses palpable, PT pulses palpable, and protective sensation intact Left Foot:  Pain with palpation of nails due to elongation and dystrophic growth.  Right Foot: Pain with palpation of nails due to elongation and dystrophic growth.   Assessment:   1. Pain due to onychomycosis of toenails of both feet     Plan:  Patient was evaluated and treated and all questions answered.   #Onychomycosis with pain  -Nails palliatively debrided as below. -Educated on self-care  Procedure: Nail Debridement Rationale: Pain Type of Debridement: manual, sharp debridement. Instrumentation: Nail nipper, rotary burr. Number of Nails: 10  Return in about 3 months (around 06/30/2023) for RFC.         Corinna Gab, DPM [facility] / [facility]

## 2023-04-01 ENCOUNTER — Emergency Department (HOSPITAL_COMMUNITY): Payer: Medicare Other

## 2023-04-01 ENCOUNTER — Encounter (HOSPITAL_COMMUNITY): Payer: Self-pay | Admitting: Internal Medicine

## 2023-04-01 ENCOUNTER — Other Ambulatory Visit: Payer: Self-pay

## 2023-04-01 ENCOUNTER — Inpatient Hospital Stay (HOSPITAL_COMMUNITY)
Admission: EM | Admit: 2023-04-01 | Discharge: 2023-04-06 | DRG: 552 | Disposition: A | Discharge status: Skilled Nursing Facility | Payer: Medicare Other | Source: Skilled Nursing Facility | Attending: Internal Medicine | Admitting: Internal Medicine

## 2023-04-01 DIAGNOSIS — Z91012 Allergy to eggs: Secondary | ICD-10-CM

## 2023-04-01 DIAGNOSIS — Z7982 Long term (current) use of aspirin: Secondary | ICD-10-CM

## 2023-04-01 DIAGNOSIS — Z91018 Allergy to other foods: Secondary | ICD-10-CM

## 2023-04-01 DIAGNOSIS — S32029A Unspecified fracture of second lumbar vertebra, initial encounter for closed fracture: Principal | ICD-10-CM | POA: Diagnosis present

## 2023-04-01 DIAGNOSIS — S32000A Wedge compression fracture of unspecified lumbar vertebra, initial encounter for closed fracture: Principal | ICD-10-CM | POA: Diagnosis present

## 2023-04-01 DIAGNOSIS — W19XXXA Unspecified fall, initial encounter: Secondary | ICD-10-CM | POA: Diagnosis present

## 2023-04-01 DIAGNOSIS — L98419 Non-pressure chronic ulcer of buttock with unspecified severity: Secondary | ICD-10-CM | POA: Diagnosis present

## 2023-04-01 DIAGNOSIS — Z880 Allergy status to penicillin: Secondary | ICD-10-CM

## 2023-04-01 DIAGNOSIS — Z882 Allergy status to sulfonamides status: Secondary | ICD-10-CM

## 2023-04-01 DIAGNOSIS — Z66 Do not resuscitate: Secondary | ICD-10-CM | POA: Diagnosis present

## 2023-04-01 DIAGNOSIS — E778 Other disorders of glycoprotein metabolism: Secondary | ICD-10-CM | POA: Diagnosis present

## 2023-04-01 DIAGNOSIS — Z91013 Allergy to seafood: Secondary | ICD-10-CM

## 2023-04-01 DIAGNOSIS — Z8673 Personal history of transient ischemic attack (TIA), and cerebral infarction without residual deficits: Secondary | ICD-10-CM

## 2023-04-01 DIAGNOSIS — Z79899 Other long term (current) drug therapy: Secondary | ICD-10-CM

## 2023-04-01 DIAGNOSIS — F0150 Vascular dementia without behavioral disturbance: Secondary | ICD-10-CM | POA: Diagnosis present

## 2023-04-01 DIAGNOSIS — M4846XA Fatigue fracture of vertebra, lumbar region, initial encounter for fracture: Secondary | ICD-10-CM | POA: Diagnosis present

## 2023-04-01 DIAGNOSIS — E039 Hypothyroidism, unspecified: Secondary | ICD-10-CM | POA: Diagnosis present

## 2023-04-01 DIAGNOSIS — Z881 Allergy status to other antibiotic agents status: Secondary | ICD-10-CM

## 2023-04-01 DIAGNOSIS — Z91010 Allergy to peanuts: Secondary | ICD-10-CM

## 2023-04-01 DIAGNOSIS — Z79890 Hormone replacement therapy (postmenopausal): Secondary | ICD-10-CM

## 2023-04-01 DIAGNOSIS — Z91011 Allergy to milk products: Secondary | ICD-10-CM

## 2023-04-01 DIAGNOSIS — Z888 Allergy status to other drugs, medicaments and biological substances status: Secondary | ICD-10-CM

## 2023-04-01 DIAGNOSIS — S32039A Unspecified fracture of third lumbar vertebra, initial encounter for closed fracture: Secondary | ICD-10-CM | POA: Diagnosis present

## 2023-04-01 LAB — BASIC METABOLIC PANEL
Anion gap: 6 (ref 5–15)
BUN: 16 mg/dL (ref 8–23)
CO2: 26 mmol/L (ref 22–32)
Calcium: 8.7 mg/dL — ABNORMAL LOW (ref 8.9–10.3)
Chloride: 103 mmol/L (ref 98–111)
Creatinine, Ser: 0.53 mg/dL — ABNORMAL LOW (ref 0.61–1.24)
GFR, Estimated: >60 mL/min (ref >60)
Glucose, Bld: 117 mg/dL — ABNORMAL HIGH (ref 70–99)
Potassium: 4.1 mmol/L (ref 3.5–5.1)
Sodium: 135 mmol/L (ref 135–145)

## 2023-04-01 LAB — CBC WITH DIFFERENTIAL/PLATELET
Abs Immature Granulocytes: 0.05 10*3/uL (ref 0.00–0.07)
Basophils Absolute: 0 10*3/uL (ref 0.0–0.1)
Basophils Relative: 0 %
Eosinophils Absolute: 0 10*3/uL (ref 0.0–0.5)
Eosinophils Relative: 0 %
HCT: 41.3 % (ref 39.0–52.0)
Hemoglobin: 13.8 g/dL (ref 13.0–17.0)
Immature Granulocytes: 1 %
Lymphocytes Relative: 6 %
Lymphs Abs: 0.5 10*3/uL — ABNORMAL LOW (ref 0.7–4.0)
MCH: 30.9 pg (ref 26.0–34.0)
MCHC: 33.4 g/dL (ref 30.0–36.0)
MCV: 92.4 fL (ref 80.0–100.0)
Monocytes Absolute: 0.7 10*3/uL (ref 0.1–1.0)
Monocytes Relative: 7 %
Neutro Abs: 7.9 10*3/uL — ABNORMAL HIGH (ref 1.7–7.7)
Neutrophils Relative %: 86 %
Platelets: 183 10*3/uL (ref 150–400)
RBC: 4.47 MIL/uL (ref 4.22–5.81)
RDW: 13.1 % (ref 11.5–15.5)
WBC: 9.3 10*3/uL (ref 4.0–10.5)
nRBC: 0 % (ref 0.0–0.2)

## 2023-04-01 LAB — HEPATIC FUNCTION PANEL
ALT: 16 U/L (ref 0–44)
AST: 26 U/L (ref 15–41)
Albumin: 3.6 g/dL (ref 3.5–5.0)
Alkaline Phosphatase: 89 U/L (ref 38–126)
Bilirubin, Direct: 0.2 mg/dL (ref 0.0–0.2)
Indirect Bilirubin: 1 mg/dL — ABNORMAL HIGH (ref 0.3–0.9)
Total Bilirubin: 1.2 mg/dL (ref 0.3–1.2)
Total Protein: 5.7 g/dL — ABNORMAL LOW (ref 6.5–8.1)

## 2023-04-01 LAB — MAGNESIUM: Magnesium: 2 mg/dL (ref 1.7–2.4)

## 2023-04-01 LAB — PHOSPHORUS: Phosphorus: 3 mg/dL (ref 2.5–4.6)

## 2023-04-01 MED ORDER — ONDANSETRON HCL 4 MG/2ML IJ SOLN: 4.0000 mg | Freq: Four times a day (QID) | INTRAMUSCULAR | Status: DC | PRN

## 2023-04-01 MED ORDER — OXYCODONE HCL 5 MG PO TABS
5.0000 mg | ORAL_TABLET | Freq: Four times a day (QID) | ORAL | Status: DC | PRN
  Filled 2023-04-01: qty 1

## 2023-04-01 MED ORDER — LEVOTHYROXINE SODIUM 88 MCG PO TABS
88.0000 ug | ORAL_TABLET | Freq: Every day | ORAL | Status: DC
  Administered 2023-04-02 – 2023-04-06 (×5): 88 ug via ORAL
  Filled 2023-04-01 (×5): qty 1

## 2023-04-01 MED ORDER — ACETAMINOPHEN 650 MG RE SUPP: 650.0000 mg | Freq: Four times a day (QID) | RECTAL | Status: DC | PRN

## 2023-04-01 MED ORDER — ACETAMINOPHEN 325 MG PO TABS
650.0000 mg | ORAL_TABLET | Freq: Once | ORAL | Status: AC
  Administered 2023-04-01: 650 mg via ORAL
  Filled 2023-04-01: qty 2

## 2023-04-01 MED ORDER — OXYCODONE HCL 5 MG PO TABS
5.0000 mg | ORAL_TABLET | Freq: Once | ORAL | Status: AC
  Administered 2023-04-01: 5 mg via ORAL
  Filled 2023-04-01: qty 1

## 2023-04-01 MED ORDER — ACETAMINOPHEN 325 MG PO TABS
650.0000 mg | ORAL_TABLET | Freq: Four times a day (QID) | ORAL | Status: DC | PRN
  Administered 2023-04-02 – 2023-04-03 (×2): 650 mg via ORAL
  Filled 2023-04-01 (×3): qty 2

## 2023-04-01 MED ORDER — ONDANSETRON HCL 4 MG PO TABS: 4.0000 mg | ORAL_TABLET | Freq: Four times a day (QID) | ORAL | Status: DC | PRN

## 2023-04-01 MED ORDER — NOREPINEPHRINE 4 MG/250ML-% IV SOLN
INTRAVENOUS | Status: AC
  Filled 2023-04-01: qty 250

## 2023-04-01 MED ORDER — ENOXAPARIN SODIUM 40 MG/0.4ML IJ SOSY
40.0000 mg | PREFILLED_SYRINGE | INTRAMUSCULAR | Status: DC
  Administered 2023-04-02 – 2023-04-05 (×4): 40 mg via SUBCUTANEOUS
  Filled 2023-04-01 (×4): qty 0.4

## 2023-04-01 MED ORDER — ASPIRIN 81 MG PO TBEC
81.0000 mg | DELAYED_RELEASE_TABLET | Freq: Every day | ORAL | Status: DC
  Administered 2023-04-01 – 2023-04-06 (×6): 81 mg via ORAL
  Filled 2023-04-01 (×6): qty 1

## 2023-04-01 NOTE — H&P (Signed)
History and Physical    Patient: Paul Hopkins:[id number] DOB: August 20, 1933 DOA: 04/01/2023 DOS: the patient was seen and examined on 04/01/2023 PCP: Joycelyn Man, NP  Patient coming from: Home  Chief Complaint:  Chief Complaint  Patient presents with   Fall   Hip Pain   HPI: Paul Hopkins is a 89 y.o. male with medical history significant of bradycardia, history of unspecified CVA, vascular dementia, delusional disorder, hypothyroidism, Mnire's disease, nonpressure chronic ulcer of buttock, osteoarthritis who was brought from [facility] due to hip pain after having a fall yesterday while he was going out of his door and someone opened it very quickly and knocked him over.  Since then, he was laying in bed because of his right hip and back.  He is partially oriented to year.  He knows he is in the hospital. He denied fever, chills, rhinorrhea, sore throat, wheezing or hemoptysis.  No chest pain, palpitations, diaphoresis, PND, orthopnea or pitting edema of the lower extremities.  No abdominal pain, nausea, emesis, diarrhea, constipation, melena or hematochezia.  No flank pain, dysuria, frequency or hematuria.  No polyuria, polydipsia, polyphagia or blurred vision.   Lab work: CBC showed a white count of   9.3, hemoglobin 13.8 g/dL and platelets 308.  BMP showed a glucose of 117 and calcium of 8.7 mg/dL, the rest of the BMP measurements were unremarkable.  Imaging: Lumbar spine x-rays show L2 and L3 compression fractures that are new since 2021 but age-indeterminate.  There is lumbar spine degeneration especially affecting lower faces with L4-L5 anterolisthesis.  Right hip x-ray with no evidence of fracture or dislocation.  No evidence of arthropathy or any other focal bony abnormality.   ED course: Initial vital signs were temperature 98.2 F, pulse 63, respirations 16, BP 160/71 mmHg O2 sat 93% on room air.  Review of Systems: As mentioned in the history of present  illness. All other systems reviewed and are negative. Past Medical History:  Diagnosis Date   Bradycardia    Delusional disorder (HCC)    Dementia (HCC)    Hypothyroidism    Hypothyroidism    Meniere disease    Non-pressure chronic ulcer of buttock (HCC)    Osteoarthritis    Stroke (HCC)    Vascular dementia (HCC)    Past Surgical History:  Procedure Laterality Date   EXCISIONAL HEMORRHOIDECTOMY     Social History:  reports that he has never smoked. He has never used smokeless tobacco. He reports that he does not drink alcohol and does not use drugs.  Allergies  Allergen Reactions   Amoxicillin Other (See Comments)    Reaction??   Banana Other (See Comments)    Hip pain   Bean Pod Extract Diarrhea and Other (See Comments)    Explosive diarrhea   Blackberry [Rubus Fruticosus] Other (See Comments)    Flares the patient's Mnire's disease   Carrot Oil Diarrhea and Other (See Comments)    Explosive diarrhea   Ciprofloxacin Other (See Comments)    Reaction??   Citric Acid Diarrhea and Other (See Comments)    Explosive diarrhea   Citrus Diarrhea and Other (See Comments)    Explosive diarrhea   Corn-Containing Products Other (See Comments)    Explosive diarrhea   Egg [Egg-Derived Products] Diarrhea and Other (See Comments)    Explosive diarrhea   Milk-Related Compounds Diarrhea and Other (See Comments)    Explosive diarrhea   Other Diarrhea and Other (See Comments)    NO  BERRIES WITH TINY SEEDS: Flares the patient's Mnire's disease  Bread- Explosive diarrhea   Peanut-Containing Drug Products Diarrhea and Other (See Comments)    NO LEGUMES!! NO PEAS!! Explosive diarrhea   Raspberry Other (See Comments)    Flares the patient's Mnire's disease   Strawberry Extract Other (See Comments)    Flares the patient's Mnire's disease   Sulfa Antibiotics Other (See Comments)    Reaction??   Trimethoprim Other (See Comments)    Reaction??   Whey Diarrhea and Other (See  Comments)    Explosive diarrhea   Family History  Problem Relation Age of Onset   Colon cancer Father    Prior to Admission medications   Medication Sig Start Date End Date Taking? Authorizing Provider  ARIPiprazole (ABILIFY) 10 MG tablet Take 10 mg by mouth daily.    [provider]  Ascorbic Acid (VITAMIN C PO) Take by mouth.    [provider]  aspirin EC 81 MG tablet Take 81 mg by mouth daily. Swallow whole.    [provider]  benzonatate (TESSALON) 100 MG capsule Take by mouth 3 (three) times daily as needed for cough. Patient not taking: Reported on 05/19/2021    [provider]  carbamide peroxide (DEBROX) 6.5 % OTIC solution 5 drops 2 (two) times daily.    [provider]  ciclopirox (PENLAC) 8 % solution Apply topically. Patient not taking: Reported on 05/19/2021 04/16/21   [provider]  Digestive Enzyme CAPS Take 1 capsule by mouth See admin instructions. Take 1 capsule by mouth at 5 PM daily    [provider]  levothyroxine (SYNTHROID) 88 MCG tablet Take 88 mcg by mouth daily before breakfast.    [provider]  Multiple Vitamin (MULTI-VITAMIN PO) Take by mouth.    [provider]  nystatin (MYCOSTATIN/NYSTOP) powder Apply 1 application topically 3 (three) times daily. Patient not taking: Reported on 05/19/2021    [provider]  SODIUM FLUORIDE PO 30 mLs by Mouth Rinse route in the morning.    [provider]  TRIAMCINOLONE ACETONIDE, TOP, 0.05 % OINT Apply topically.    [provider]  VITAMIN D PO Take by mouth.    [provider]    Physical Exam: Vitals:   04/01/23 0810 04/01/23 0814 04/01/23 1138  BP: (!) 160/71  (!) 140/63  Pulse: 63  (!) 57  Resp: 16  16  Temp: 98.2 F (36.8 C)    TempSrc: Oral    SpO2: 93%  93%  Weight:  68 kg   Height:  6' (1.829 m)    Physical Exam Vitals and nursing note reviewed.  Constitutional:      General: He  is awake.     Appearance: Normal appearance. He is normal weight.  HENT:     Head: Normocephalic.     Nose: No rhinorrhea.     Mouth/Throat:     Mouth: Mucous membranes are moist.  Eyes:     General: No scleral icterus.    Pupils: Pupils are equal, round, and reactive to light.  Neck:     Vascular: No JVD.  Cardiovascular:     Rate and Rhythm: Normal rate and regular rhythm.     Heart sounds: S1 normal and S2 normal.  Pulmonary:     Effort: Pulmonary effort is normal.  Abdominal:     General: Bowel sounds are normal.     Palpations: Abdomen is soft.  Musculoskeletal:     Cervical  back: Neck supple.     Right lower leg: No edema.     Left lower leg: No edema.  Skin:    General: Skin is warm and dry.  Neurological:     General: No focal deficit present.     Mental Status: He is alert.  Psychiatric:        Mood and Affect: Mood normal.        Behavior: Behavior normal. Behavior is cooperative.   Data Reviewed:  Results are pending, will review when available.  Assessment and Plan: Principal Problem:   Lumbar compression fracture, closed, initial encounter (HCC) Observation/telemetry. Continue IV fluids. Keep n.p.o. for now. Analgesics as needed. Antiemetics as needed. Pantoprazole 40 mg IVP daily. Follow CBC, CMP and lipase in AM.  Active Problems:   Vascular dementia (HCC) Supportive care.    Hypothyroidism (acquired) Continue levothyroxine 88 mcg p.o. daily.    Hypoproteinemia (HCC) May benefit from protein supplementation. Consider nutritional services evaluation. Follow-up albumin/globulin level.    Advance Care Planning:   Code Status: Full Code   Consults: PT and TOC team.  Family Communication:   Severity of Illness: The appropriate patient status for this patient is OBSERVATION. Observation status is judged to be reasonable and necessary in order to provide the required intensity of service to ensure the patient's safety. The patient's  presenting symptoms, physical exam findings, and initial radiographic and laboratory data in the context of their medical condition is felt to place them at decreased risk for further clinical deterioration. Furthermore, it is anticipated that the patient will be medically stable for discharge from the hospital within 2 midnights of admission.   Author: Bobette Mo, MD 04/01/2023 11:52 AM  For on call review [email or web address].   This document was prepared using Dragon voice recognition software and may contain some unintended transcription errors.

## 2023-04-01 NOTE — ED Provider Notes (Signed)
[place] EMERGENCY DEPARTMENT AT [facility] Provider Note   CSN: [id number] Arrival date & time: 04/01/23  0750     History  Chief Complaint  Patient presents with   Fall   Hip Pain    Paul Hopkins is a 89 y.o. male.  HPI 89 year old male presents with a fall and right hip/back pain.  Patient indicates the pain is primarily in his hip though later tells me is also in his back.  He fell yesterday when he was getting ready to go out of his door and someone opened the door hurriedly and knocked him over.  He has been laying in bed because he was unable to stand up on his hip.  He otherwise denies any illness and no head injury.  No numbness or weakness in his leg.  Home Medications Prior to Admission medications   Medication Sig Start Date End Date Taking? Authorizing Provider  ARIPiprazole (ABILIFY) 10 MG tablet Take 10 mg by mouth daily.   Yes [provider]  Ascorbic Acid (VITAMIN C PO) Take by mouth.   Yes [provider]  aspirin EC 81 MG tablet Take 81 mg by mouth daily. Swallow whole. 4pm   Yes [provider]  levothyroxine (SYNTHROID) 88 MCG tablet Take 88 mcg by mouth daily before breakfast.   Yes [provider]  Multiple Vitamins-Minerals (PRESERVISION AREDS 2 PO) Take 1 capsule by mouth in the morning and at bedtime.   Yes [provider]  Probiotic Product (PROBIOTIC BLEND PO) Take 1 capsule by mouth in the morning and at bedtime.   Yes [provider]  VITAMIN D PO Take 2,000 Units by mouth daily. 4 pm   Yes [provider]  Multiple Vitamin (MULTI-VITAMIN PO) Take 1 tablet by mouth daily. 4 pm. Centrum silver men 50+    [provider]      Allergies    Amoxicillin, Banana, Bean pod extract, Blackberry [rubus fruticosus], Carrot oil, Ciprofloxacin, Citric acid, Citrus, Corn-containing products, Egg [egg-derived products], Milk-related compounds, Other, Peanut-containing drug  products, Raspberry, Strawberry extract, Sulfa antibiotics, Trimethoprim, and Whey    Review of Systems   Review of Systems  Gastrointestinal:  Negative for abdominal pain.  Musculoskeletal:  Positive for arthralgias and back pain.  Neurological:  Negative for weakness, numbness and headaches.    Physical Exam Updated Vital Signs BP (!) 140/63 (BP Location: Right Arm)   Pulse (!) 57   Temp 98.7 F (37.1 C) (Oral)   Resp 16   Ht 6' (1.829 m)   Wt 68 kg   SpO2 93%   BMI 20.34 kg/m  Physical Exam Vitals and nursing note reviewed.  Constitutional:      Appearance: He is well-developed.  HENT:     Head: Normocephalic and atraumatic.  Cardiovascular:     Rate and Rhythm: Normal rate and regular rhythm.     Pulses:          Dorsalis pedis pulses are 2+ on the right side.  Pulmonary:     Effort: Pulmonary effort is normal.  Abdominal:     Palpations: Abdomen is soft.     Tenderness: There is no abdominal tenderness.  Musculoskeletal:     Thoracic back: No tenderness.     Lumbar back: No tenderness.     Right hip: No deformity, tenderness or bony tenderness. Normal range of motion.     Right upper leg: No tenderness.     Right knee: No  swelling. Normal range of motion. No tenderness.     Right lower leg: No tenderness.     Comments: Patient is able to lift his right leg off the stretcher on his own.  He does not have any pain with range of motion of his hip or knee.  I am unable to reproduce any back tenderness but he is unable to sit up from his seated position due to severe pain it induces in his back.  Skin:    General: Skin is warm and dry.  Neurological:     Mental Status: He is alert.     Comments: No appreciable weakness in either leg.  Grossly normal sensation in both legs.     ED Results / Procedures / Treatments   Labs (all labs ordered are listed, but only abnormal results are displayed) Labs Reviewed  BASIC METABOLIC PANEL - Abnormal; Notable for the  following components:      Result Value   Glucose, Bld 117 (*)    Creatinine, Ser 0.53 (*)    Calcium 8.7 (*)    All other components within normal limits  CBC WITH DIFFERENTIAL/PLATELET - Abnormal; Notable for the following components:   Neutro Abs 7.9 (*)    Lymphs Abs 0.5 (*)    All other components within normal limits  HEPATIC FUNCTION PANEL  PHOSPHORUS  MAGNESIUM    EKG None  Radiology DG Lumbar Spine Complete  Result Date: 04/01/2023 CLINICAL DATA:  Fall. EXAM: LUMBAR SPINE - COMPLETE 4+ VIEW COMPARISON:  11/27/2019 FINDINGS: L2 and L3 compression fractures without detected acute feature. Height loss is up to moderate at L3. There is lumbar spine degeneration especially affecting facets at L3-4 and below with mild anterolisthesis at L4-5. Subjective osteopenia. No aggressive bone lesion. IMPRESSION: 1. L2 and L3 compression fractures, new since 2021 abdominal CT but age indeterminate. 2. Lumbar spine degeneration especially affecting lower facets with L4-5 anterolisthesis. Electronically Signed   By: Tiburcio Pea M.D.   On: 04/01/2023 09:16   DG Hip Unilat W or Wo Pelvis 2-3 Views Right  Result Date: 04/01/2023 CLINICAL DATA:  Fall. EXAM: DG HIP (WITH OR WITHOUT PELVIS) 3V RIGHT COMPARISON:  None Available. FINDINGS: There is no evidence of hip fracture or dislocation. There is no evidence of arthropathy or other focal bone abnormality. Subjective osteopenia. IMPRESSION: Negative for fracture or dislocation. Electronically Signed   By: Tiburcio Pea M.D.   On: 04/01/2023 09:00    Procedures Procedures    Medications Ordered in ED Medications  oxyCODONE (Oxy IR/ROXICODONE) immediate release tablet 5 mg (has no administration in time range)  enoxaparin (LOVENOX) injection 40 mg (has no administration in time range)  acetaminophen (TYLENOL) tablet 650 mg (has no administration in time range)    Or  acetaminophen (TYLENOL) suppository 650 mg (has no administration in  time range)  ondansetron (ZOFRAN) tablet 4 mg (has no administration in time range)    Or  ondansetron (ZOFRAN) injection 4 mg (has no administration in time range)  norepinephrine (LEVOPHED) 4-5 MG/250ML-% infusion SOLN (has no administration in time range)  acetaminophen (TYLENOL) tablet 650 mg (650 mg Oral Given 04/01/23 0900)  oxyCODONE (Oxy IR/ROXICODONE) immediate release tablet 5 mg (5 mg Oral Given 04/01/23 7829)    ED Course/ Medical Decision Making/ A&P                                 Medical Decision  Making Amount and/or Complexity of Data Reviewed Independent Historian: EMS Labs: ordered.    Details: Unremarkable Radiology: ordered and independent interpretation performed.    Details: Compression fractures, likely acute  Risk OTC drugs. Prescription drug management. Decision regarding hospitalization.   Patient presents with a fall from yesterday.  Unable to get up.  Seems to have compression fractures in his back that correlates to his severe back pain with movement.  No hip/pelvic fracture.  Unfortunately he is still only able to sit up while in the emergency department despite oral pain control.  He will need continued pain management and a lumbar corset.  I did discuss with Dr. Danielle Dess, not much to do besides that and he can see the patient as an outpatient in a couple weeks.  No neurofindings.  Do not think an emergent MRI needed.  I do not think he can go back to his assisted living given he is unable to sit up in bed and so I discussed with Dr. Robb Matar for pain control and admission.        Final Clinical Impression(s) / ED Diagnoses Final diagnoses:  Compression fracture of lumbar vertebra, unspecified lumbar vertebral level, initial encounter [facility])    Rx / DC Orders ED Discharge Orders     None         Pricilla Loveless, MD 04/01/23 1405

## 2023-04-01 NOTE — Plan of Care (Signed)
  Problem: Education: Goal: Knowledge of General Education information will improve Description: Including pain rating scale, medication(s)/side effects and non-pharmacologic comfort measures Outcome: Progressing   Problem: Clinical Measurements: Goal: Ability to maintain clinical measurements within normal limits will improve Outcome: Progressing   Problem: Nutrition: Goal: Adequate nutrition will be maintained Outcome: Progressing   Problem: Coping: Goal: Level of anxiety will decrease Outcome: Progressing   Problem: Elimination: Goal: Will not experience complications related to urinary retention Outcome: Progressing   Problem: Pain Managment: Goal: General experience of comfort will improve Outcome: Progressing   Problem: Safety: Goal: Ability to remain free from injury will improve Outcome: Progressing

## 2023-04-01 NOTE — ED Notes (Signed)
ED TO INPATIENT HANDOFF REPORT  Name/Age/Gender Paul Hopkins 89 y.o. male  Code Status    Code Status Orders  (From admission, onward)           Start     Ordered   04/01/23 1250  Full code  Continuous       Question:  By:  Answer:  Consent: discussion documented in EHR   04/01/23 1252           Code Status History     Date Active Date Inactive Code Status Order ID Comments User Context   11/28/2019 0847 11/29/2019 2146 DNR [id number]  Ernie Avena, NP Inpatient   11/27/2019 2212 11/28/2019 0847 Partial Code [id number]  Derrel Nip, MD Inpatient       Home/SNF/Other Skilled nursing facility  Chief Complaint Lumbar compression fracture, closed, initial encounter (HCC) [S32.000A]  Level of Care/Admitting Diagnosis ED Disposition     ED Disposition  Admit   Condition  --   Comment  Hospital Area: [facility] [[id number]]  Level of Care: Telemetry [5]  Admit to tele based on following criteria: Other see comments  Comments: Bradycardia  May place patient in observation at [facility] or [facility] if equivalent level of care is available:: No  Covid Evaluation: Asymptomatic - no recent exposure (last 10 days) testing not required  Diagnosis: Lumbar compression fracture, closed, initial encounter [facility]) [[id number]]  Admitting Physician: Bobette Mo [[id number]]  Attending Physician: Bobette Mo [[id number]]          Medical History Past Medical History:  Diagnosis Date   Bradycardia    Delusional disorder (HCC)    Dementia (HCC)    Hypothyroidism    Hypothyroidism    Meniere disease    Non-pressure chronic ulcer of buttock (HCC)    Osteoarthritis    Stroke (HCC)    Vascular dementia (HCC)     Allergies Allergies  Allergen Reactions   Amoxicillin Other (See Comments)    Reaction??   Banana Other (See Comments)    Hip pain   Bean Pod Extract Diarrhea and Other (See Comments)    Explosive diarrhea    Blackberry [Rubus Fruticosus] Other (See Comments)    Flares the patient's Mnire's disease   Carrot Oil Diarrhea and Other (See Comments)    Explosive diarrhea   Ciprofloxacin Other (See Comments)    Reaction??   Citric Acid Diarrhea and Other (See Comments)    Explosive diarrhea   Citrus Diarrhea and Other (See Comments)    Explosive diarrhea   Corn-Containing Products Other (See Comments)    Explosive diarrhea   Egg [Egg-Derived Products] Diarrhea and Other (See Comments)    Explosive diarrhea   Milk-Related Compounds Diarrhea and Other (See Comments)    Explosive diarrhea   Other Diarrhea and Other (See Comments)    NO BERRIES WITH TINY SEEDS: Flares the patient's Mnire's disease  Bread- Explosive diarrhea   Peanut-Containing Drug Products Diarrhea and Other (See Comments)    NO LEGUMES!! NO PEAS!! Explosive diarrhea   Raspberry Other (See Comments)    Flares the patient's Mnire's disease   Strawberry Extract Other (See Comments)    Flares the patient's Mnire's disease   Sulfa Antibiotics Other (See Comments)    Reaction??   Trimethoprim Other (See Comments)    Reaction??   Whey Diarrhea and Other (See Comments)    Explosive diarrhea    IV Location/Drains/Wounds Patient Lines/Drains/Airways Status     Active  Line/Drains/Airways     None            Labs/Imaging Results for orders placed or performed during the hospital encounter of 04/01/23 (from the past 48 hour(s))  Basic metabolic panel     Status: Abnormal   Collection Time: 04/01/23 11:11 AM  Result Value Ref Range   Sodium 135 135 - 145 mmol/L   Potassium 4.1 3.5 - 5.1 mmol/L   Chloride 103 98 - 111 mmol/L   CO2 26 22 - 32 mmol/L   Glucose, Bld 117 (H) 70 - 99 mg/dL    Comment: Glucose reference range applies only to samples taken after fasting for at least 8 hours.   BUN 16 8 - 23 mg/dL   Creatinine, Ser 1.61 (L) 0.61 - 1.24 mg/dL   Calcium 8.7 (L) 8.9 - 10.3 mg/dL   GFR, Estimated >09 >60  mL/min    Comment: (NOTE) Calculated using the CKD-EPI Creatinine Equation (2021)    Anion gap 6 5 - 15    Comment: Performed at [facility], [address], [place], [place] [address]  CBC with Differential     Status: Abnormal   Collection Time: 04/01/23 11:11 AM  Result Value Ref Range   WBC 9.3 4.0 - 10.5 K/uL   RBC 4.47 4.22 - 5.81 MIL/uL   Hemoglobin 13.8 13.0 - 17.0 g/dL   HCT 81.1 91.4 - 78.2 %   MCV 92.4 80.0 - 100.0 fL   MCH 30.9 26.0 - 34.0 pg   MCHC 33.4 30.0 - 36.0 g/dL   RDW 95.6 21.3 - 08.6 %   Platelets 183 150 - 400 K/uL   nRBC 0.0 0.0 - 0.2 %   Neutrophils Relative % 86 %   Neutro Abs 7.9 (H) 1.7 - 7.7 K/uL   Lymphocytes Relative 6 %   Lymphs Abs 0.5 (L) 0.7 - 4.0 K/uL   Monocytes Relative 7 %   Monocytes Absolute 0.7 0.1 - 1.0 K/uL   Eosinophils Relative 0 %   Eosinophils Absolute 0.0 0.0 - 0.5 K/uL   Basophils Relative 0 %   Basophils Absolute 0.0 0.0 - 0.1 K/uL   Immature Granulocytes 1 %   Abs Immature Granulocytes 0.05 0.00 - 0.07 K/uL    Comment: Performed at [facility], [address] [place], [place] [address]  Hepatic function panel     Status: Abnormal   Collection Time: 04/01/23 11:11 AM  Result Value Ref Range   Total Protein 5.7 (L) 6.5 - 8.1 g/dL   Albumin 3.6 3.5 - 5.0 g/dL   AST 26 15 - 41 U/L   ALT 16 0 - 44 U/L   Alkaline Phosphatase 89 38 - 126 U/L   Total Bilirubin 1.2 0.3 - 1.2 mg/dL   Bilirubin, Direct 0.2 0.0 - 0.2 mg/dL   Indirect Bilirubin 1.0 (H) 0.3 - 0.9 mg/dL    Comment: Performed at [facility], [address], [place], [place] [address]  Phosphorus     Status: None   Collection Time: 04/01/23 11:11 AM  Result Value Ref Range   Phosphorus 3.0 2.5 - 4.6 mg/dL    Comment: Performed at [facility], [address], [place], [place] [address]  Magnesium     Status: None   Collection Time: 04/01/23 11:11 AM  Result Value Ref Range   Magnesium  2.0 1.7 - 2.4 mg/dL    Comment: Performed at [facility], [address], [place], [place] [address]   DG Lumbar Spine Complete  Result Date: 04/01/2023 CLINICAL DATA:  Fall. EXAM: LUMBAR SPINE - COMPLETE 4+ VIEW COMPARISON:  11/27/2019 FINDINGS: L2 and L3 compression fractures without detected acute feature. Height loss is up to moderate at L3. There is lumbar spine degeneration especially affecting facets at L3-4 and below with mild anterolisthesis at L4-5. Subjective osteopenia. No aggressive bone lesion. IMPRESSION: 1. L2 and L3 compression fractures, new since 2021 abdominal CT but age indeterminate. 2. Lumbar spine degeneration especially affecting lower facets with L4-5 anterolisthesis. Electronically Signed   By: Tiburcio Pea M.D.   On: 04/01/2023 09:16   DG Hip Unilat W or Wo Pelvis 2-3 Views Right  Result Date: 04/01/2023 CLINICAL DATA:  Fall. EXAM: DG HIP (WITH OR WITHOUT PELVIS) 3V RIGHT COMPARISON:  None Available. FINDINGS: There is no evidence of hip fracture or dislocation. There is no evidence of arthropathy or other focal bone abnormality. Subjective osteopenia. IMPRESSION: Negative for fracture or dislocation. Electronically Signed   By: Tiburcio Pea M.D.   On: 04/01/2023 09:00    Pending Labs Unresulted Labs (From admission, onward)    None       Vitals/Pain Today's Vitals   04/01/23 0814 04/01/23 1138 04/01/23 1234 04/01/23 1445  BP:  (!) 140/63  123/68  Pulse:  (!) 57  (!) 54  Resp:  16  16  Temp:   98.7 F (37.1 C)   TempSrc:   Oral   SpO2:  93%  95%  Weight: 68 kg     Height: 6' (1.829 m)       Isolation Precautions No active isolations  Medications Medications  oxyCODONE (Oxy IR/ROXICODONE) immediate release tablet 5 mg (has no administration in time range)  enoxaparin (LOVENOX) injection 40 mg (has no administration in time range)  acetaminophen (TYLENOL) tablet 650 mg (has no administration in time range)    Or   acetaminophen (TYLENOL) suppository 650 mg (has no administration in time range)  ondansetron (ZOFRAN) tablet 4 mg (has no administration in time range)    Or  ondansetron (ZOFRAN) injection 4 mg (has no administration in time range)  norepinephrine (LEVOPHED) 4-5 MG/250ML-% infusion SOLN (has no administration in time range)  acetaminophen (TYLENOL) tablet 650 mg (650 mg Oral Given 04/01/23 0900)  oxyCODONE (Oxy IR/ROXICODONE) immediate release tablet 5 mg (5 mg Oral Given 04/01/23 0947)    Mobility walks

## 2023-04-01 NOTE — Plan of Care (Signed)
  Problem: Education: Goal: Knowledge of General Education information will improve Description: Including pain rating scale, medication(s)/side effects and non-pharmacologic comfort measures 04/01/2023 1659 by Beverly Sessions, RN Outcome: Progressing 04/01/2023 1659 by Beverly Sessions, RN Outcome: Progressing   Problem: Nutrition: Goal: Adequate nutrition will be maintained 04/01/2023 1659 by Beverly Sessions, RN Outcome: Progressing 04/01/2023 1659 by Beverly Sessions, RN Outcome: Progressing   Problem: Activity: Goal: Risk for activity intolerance will decrease 04/01/2023 1659 by Beverly Sessions, RN Outcome: Progressing 04/01/2023 1659 by Beverly Sessions, RN Outcome: Progressing

## 2023-04-01 NOTE — ED Triage Notes (Signed)
Pt BIB EMS from [facility], c/o hip pain s/p fall from yesterday. Per EMS, pt reported pain with movement and pressure and sitting for prolonged time. No thinners, no LOC  BP 142/80 P 62 SpO2 94%

## 2023-04-02 DIAGNOSIS — S32000A Wedge compression fracture of unspecified lumbar vertebra, initial encounter for closed fracture: Secondary | ICD-10-CM | POA: Diagnosis not present

## 2023-04-02 MED ORDER — ARIPIPRAZOLE 10 MG PO TABS
10.0000 mg | ORAL_TABLET | Freq: Every day | ORAL | Status: DC
  Administered 2023-04-02 – 2023-04-06 (×5): 10 mg via ORAL
  Filled 2023-04-02 (×5): qty 1

## 2023-04-02 NOTE — Progress Notes (Signed)
Orthopedic Tech Progress Note Patient Details:  Paul Hopkins 1934-01-13 [id number]  Ortho Devices Type of Ortho Device: Lumbar corsett Ortho Device/Splint Interventions: Ordered, Application, Adjustment   Post Interventions Patient Tolerated: Well Instructions Provided: Adjustment of device, Care of device  Tonye Pearson 04/02/2023, 11:22 AM

## 2023-04-02 NOTE — Evaluation (Signed)
Physical Therapy Evaluation Patient Details Name: Paul Hopkins MRN: [id number] DOB: 05/24/1934 Today's Date: 04/02/2023  History of Present Illness  Pt admitted from [facility] s/p fall with imaging revealing Compression fxs at L2 and L3 but of indeterminate age.  Pt with hx of dementia, CVA, delusional disorder, and non-pressure chronic ulcer of buttock.  Clinical Impression  Pt admitted as above and presenting with functional mobility limitations 2* generalized weakness and balance deficits.  This date, pt requiring significant assist for safe performance of basic mobility tasks.  Patient will benefit from continued inpatient follow up therapy, <3 hours/day to maximize IND and safety and return to PLOF.         If plan is discharge home, recommend the following: A lot of help with walking and/or transfers;A lot of help with bathing/dressing/bathroom;Assistance with cooking/housework;Assist for transportation;Help with stairs or ramp for entrance   Can travel by private vehicle   No    Equipment Recommendations None recommended by PT  Recommendations for Other Services       Functional Status Assessment Patient has had a recent decline in their functional status and demonstrates the ability to make significant improvements in function in a reasonable and predictable amount of time.     Precautions / Restrictions Precautions Precautions: Fall Required Braces or Orthoses: Spinal Brace Restrictions Weight Bearing Restrictions: No      Mobility  Bed Mobility Overal bed mobility: Needs Assistance Bed Mobility: Rolling, Sidelying to Sit Rolling: Mod assist Sidelying to sit: Mod assist       General bed mobility comments: cues for log roll technique; increased assist and use of bed pad to scoot fwd to EOB sitting    Transfers Overall transfer level: Needs assistance Equipment used: Rolling walker (2 wheels) Transfers: Sit to/from Stand Sit to  Stand: Min assist, Mod assist, +2 safety/equipment, From elevated surface           General transfer comment: cues for transition position on EOB and use of UEs to self assist.  Physical assist to bring wt up and fwd and to balance in standing with RW    Ambulation/Gait Ambulation/Gait assistance: Min assist, Mod assist, +2 safety/equipment (chair follow) Gait Distance (Feet): 28 Feet Assistive device: Rolling walker (2 wheels) Gait Pattern/deviations: Step-to pattern, Decreased step length - right, Decreased step length - left, Shuffle, Trunk flexed Gait velocity: decr     General Gait Details: short stutter steps, post drift, assist for balance and RW management  Stairs            Wheelchair Mobility     Tilt Bed    Modified Rankin (Stroke Patients Only)       Balance Overall balance assessment: Needs assistance Sitting-balance support: No upper extremity supported, Feet supported Sitting balance-Leahy Scale: Fair     Standing balance support: Bilateral upper extremity supported Standing balance-Leahy Scale: Poor                               Pertinent Vitals/Pain Pain Assessment Pain Assessment: No/denies pain    Home Living Family/patient expects to be discharged to:: Unsure                   Additional Comments: Pt from IND living at [facility]    Prior Function Prior Level of Function : Needs assist             Mobility Comments: Pt  on RW but able to walk in apt and to/from dining room at IND living ADLs Comments: Assist of staff for meals, shaving , dressing     Extremity/Trunk Assessment   Upper Extremity Assessment Upper Extremity Assessment: Generalized weakness    Lower Extremity Assessment Lower Extremity Assessment: Generalized weakness    Cervical / Trunk Assessment Cervical / Trunk Assessment: Kyphotic  Communication   Communication Communication: Hearing impairment  Cognition Arousal:  Alert Behavior During Therapy: WFL for tasks assessed/performed Overall Cognitive Status: History of cognitive impairments - at baseline                                          General Comments      Exercises     Assessment/Plan    PT Assessment Patient needs continued PT services  PT Problem List Decreased strength;Decreased activity tolerance;Decreased balance;Decreased mobility;Decreased knowledge of use of DME;Decreased cognition       PT Treatment Interventions DME instruction;Gait training;Functional mobility training;Therapeutic activities;Therapeutic exercise;Patient/family education;Balance training    PT Goals (Current goals can be found in the Care Plan section)  Acute Rehab PT Goals Patient Stated Goal: Get out of bed PT Goal Formulation: With patient Time For Goal Achievement: 04/16/23 Potential to Achieve Goals: Fair    Frequency Min 1X/week     Co-evaluation               AM-PAC PT "6 Clicks" Mobility  Outcome Measure Help needed turning from your back to your side while in a flat bed without using bedrails?: A Lot Help needed moving from lying on your back to sitting on the side of a flat bed without using bedrails?: A Lot Help needed moving to and from a bed to a chair (including a wheelchair)?: A Lot Help needed standing up from a chair using your arms (e.g., wheelchair or bedside chair)?: A Lot Help needed to walk in hospital room?: A Lot Help needed climbing 3-5 steps with a railing? : Total 6 Click Score: 11    End of Session Equipment Utilized During Treatment: Gait belt Activity Tolerance: Patient tolerated treatment well Patient left: in chair;with call bell/phone within reach;with chair alarm set Nurse Communication: Mobility status PT Visit Diagnosis: Muscle weakness (generalized) (M62.81);History of falling (Z91.81);Difficulty in walking, not elsewhere classified (R26.2);Unsteadiness on feet (R26.81)    Time:  1420-1450 PT Time Calculation (min) (ACUTE ONLY): 30 min   Charges:   PT Evaluation $PT Eval Low Complexity: 1 Low PT Treatments $Gait Training: 8-22 mins PT General Charges $$ ACUTE PT VISIT: 1 Visit         Mauro Kaufmann PT Acute Rehabilitation Services Pager [phone] Office [phone]   Paul Hopkins 04/02/2023, 3:57 PM

## 2023-04-02 NOTE — Progress Notes (Signed)
PROGRESS NOTE    Paul Hopkins  AOZ:[id number] DOB: [birth year] DOA: 04/01/2023 PCP: Joycelyn Man, NP   Brief Narrative:  89 y.o. male with medical history significant of bradycardia, history of unspecified CVA, vascular dementia, delusional disorder, hypothyroidism, Mnire's disease, nonpressure chronic ulcer of buttock, osteoarthritis presented with hip pain after a fall.  Workup revealed L2-L3 compression fractures, new since 2021 but age indeterminate.  Right hip x-ray did not show any fracture or dislocation.  He was admitted for intractable pain.  Assessment & Plan:   Close lumbar compression fracture after a mechanical fall -Workup revealed L2-L3 compression fractures, new since 2021 but age indeterminate.  Right hip x-ray did not show any fracture or dislocation.   -Pain management.  Fall precautions. -PT/OT eval.  Vascular dementia -Delirium precautions.  Fall precautions.  PT eval.  Resume home regimen once med scale verified  Hypothyroidism -continue levothyroxine  History of unspecified CVA -Continue aspirin.  Outpatient follow-up with neurology.   DVT prophylaxis: Lovenox Code Status: DNR Family Communication: None at bedside Disposition Plan: Status is: Observation The patient will require care spanning > 2 midnights and should be moved to inpatient because: Of severity of illness.  Consultants: None  Procedures: None  Antimicrobials: None   Subjective: Patient seen and examined at bedside.  Poor historian.  No fever, vomiting, agitation reported.  Complains of intermittent lower back pain.  Objective: Vitals:   04/01/23 2316 04/02/23 0155 04/02/23 0644 04/02/23 0850  BP: 134/66 (!) 150/68 (!) 149/73 (!) 154/73  Pulse:  65 64 65  Resp:  20 20 19   Temp:  98.3 F (36.8 C) 99.5 F (37.5 C) 98 F (36.7 C)  TempSrc:    Oral  SpO2: 94% 93% 94% 94%  Weight:      Height:        Intake/Output Summary (Last 24 hours) at 04/02/2023  1032 Last data filed at 04/02/2023 0524 Gross per 24 hour  Intake 180 ml  Output --  Net 180 ml   Filed Weights   04/01/23 0814  Weight: 68 kg    Examination:  General exam: Appears calm and comfortable.  Elderly male lying in bed.  On room air. Respiratory system: Bilateral decreased breath sounds at bases, no wheezing Cardiovascular system: S1 & S2 heard, Rate controlled Gastrointestinal system: Abdomen is nondistended, soft and nontender. Normal bowel sounds heard. Extremities: No cyanosis, clubbing, edema  Central nervous system: Awake, slow to respond, answers some questions.  No focal neurological deficits. Moving extremities Skin: No rashes, lesions or ulcers Psychiatry: Flat affect.  Not agitated.  Data Reviewed: I have personally reviewed following labs and imaging studies  CBC: Recent Labs  Lab 04/01/23 1111  WBC 9.3  NEUTROABS 7.9*  HGB 13.8  HCT 41.3  MCV 92.4  PLT 183   Basic Metabolic Panel: Recent Labs  Lab 04/01/23 1111  NA 135  K 4.1  CL 103  CO2 26  GLUCOSE 117*  BUN 16  CREATININE 0.53*  CALCIUM 8.7*  MG 2.0  PHOS 3.0   GFR: Estimated Creatinine Clearance: 62.6 mL/min (A) (by C-G formula based on SCr of 0.53 mg/dL (L)). Liver Function Tests: Recent Labs  Lab 04/01/23 1111  AST 26  ALT 16  ALKPHOS 89  BILITOT 1.2  PROT 5.7*  ALBUMIN 3.6   No results for input(s): "LIPASE", "AMYLASE" in the last 168 hours. No results for input(s): "AMMONIA" in the last 168 hours. Coagulation Profile: No results for input(s): "INR", "PROTIME" in  the last 168 hours. Cardiac Enzymes: No results for input(s): "CKTOTAL", "CKMB", "CKMBINDEX", "TROPONINI" in the last 168 hours. BNP (last 3 results) No results for input(s): "PROBNP" in the last 8760 hours. HbA1C: No results for input(s): "HGBA1C" in the last 72 hours. CBG: No results for input(s): "GLUCAP" in the last 168 hours. Lipid Profile: No results for input(s): "CHOL", "HDL", "LDLCALC",  "TRIG", "CHOLHDL", "LDLDIRECT" in the last 72 hours. Thyroid Function Tests: No results for input(s): "TSH", "T4TOTAL", "FREET4", "T3FREE", "THYROIDAB" in the last 72 hours. Anemia Panel: No results for input(s): "VITAMINB12", "FOLATE", "FERRITIN", "TIBC", "IRON", "RETICCTPCT" in the last 72 hours. Sepsis Labs: No results for input(s): "PROCALCITON", "LATICACIDVEN" in the last 168 hours.  No results found for this or any previous visit (from the past 240 hour(s)).       Radiology Studies: DG Lumbar Spine Complete  Result Date: 04/01/2023 CLINICAL DATA:  Fall. EXAM: LUMBAR SPINE - COMPLETE 4+ VIEW COMPARISON:  11/27/2019 FINDINGS: L2 and L3 compression fractures without detected acute feature. Height loss is up to moderate at L3. There is lumbar spine degeneration especially affecting facets at L3-4 and below with mild anterolisthesis at L4-5. Subjective osteopenia. No aggressive bone lesion. IMPRESSION: 1. L2 and L3 compression fractures, new since 2021 abdominal CT but age indeterminate. 2. Lumbar spine degeneration especially affecting lower facets with L4-5 anterolisthesis. Electronically Signed   By: Tiburcio Pea M.D.   On: 04/01/2023 09:16   DG Hip Unilat W or Wo Pelvis 2-3 Views Right  Result Date: 04/01/2023 CLINICAL DATA:  Fall. EXAM: DG HIP (WITH OR WITHOUT PELVIS) 3V RIGHT COMPARISON:  None Available. FINDINGS: There is no evidence of hip fracture or dislocation. There is no evidence of arthropathy or other focal bone abnormality. Subjective osteopenia. IMPRESSION: Negative for fracture or dislocation. Electronically Signed   By: Tiburcio Pea M.D.   On: 04/01/2023 09:00        Scheduled Meds:  aspirin EC  81 mg Oral Daily   enoxaparin (LOVENOX) injection  40 mg Subcutaneous Q24H   levothyroxine  88 mcg Oral QAC breakfast   Continuous Infusions:        Glade Lloyd, MD [facility] 04/02/2023, 10:32 AM

## 2023-04-03 DIAGNOSIS — F0150 Vascular dementia without behavioral disturbance: Secondary | ICD-10-CM | POA: Diagnosis present

## 2023-04-03 DIAGNOSIS — S32000A Wedge compression fracture of unspecified lumbar vertebra, initial encounter for closed fracture: Secondary | ICD-10-CM | POA: Diagnosis present

## 2023-04-03 DIAGNOSIS — Z882 Allergy status to sulfonamides status: Secondary | ICD-10-CM | POA: Diagnosis not present

## 2023-04-03 DIAGNOSIS — Z79899 Other long term (current) drug therapy: Secondary | ICD-10-CM | POA: Diagnosis not present

## 2023-04-03 DIAGNOSIS — Z91012 Allergy to eggs: Secondary | ICD-10-CM | POA: Diagnosis not present

## 2023-04-03 DIAGNOSIS — L98419 Non-pressure chronic ulcer of buttock with unspecified severity: Secondary | ICD-10-CM | POA: Diagnosis present

## 2023-04-03 DIAGNOSIS — S32039A Unspecified fracture of third lumbar vertebra, initial encounter for closed fracture: Secondary | ICD-10-CM | POA: Diagnosis present

## 2023-04-03 DIAGNOSIS — E778 Other disorders of glycoprotein metabolism: Secondary | ICD-10-CM | POA: Diagnosis present

## 2023-04-03 DIAGNOSIS — Z7982 Long term (current) use of aspirin: Secondary | ICD-10-CM | POA: Diagnosis not present

## 2023-04-03 DIAGNOSIS — Z880 Allergy status to penicillin: Secondary | ICD-10-CM | POA: Diagnosis not present

## 2023-04-03 DIAGNOSIS — Z91018 Allergy to other foods: Secondary | ICD-10-CM | POA: Diagnosis not present

## 2023-04-03 DIAGNOSIS — M4846XA Fatigue fracture of vertebra, lumbar region, initial encounter for fracture: Secondary | ICD-10-CM | POA: Diagnosis present

## 2023-04-03 DIAGNOSIS — W19XXXA Unspecified fall, initial encounter: Secondary | ICD-10-CM | POA: Diagnosis present

## 2023-04-03 DIAGNOSIS — Z8673 Personal history of transient ischemic attack (TIA), and cerebral infarction without residual deficits: Secondary | ICD-10-CM | POA: Diagnosis not present

## 2023-04-03 DIAGNOSIS — Z91011 Allergy to milk products: Secondary | ICD-10-CM | POA: Diagnosis not present

## 2023-04-03 DIAGNOSIS — Z91013 Allergy to seafood: Secondary | ICD-10-CM | POA: Diagnosis not present

## 2023-04-03 DIAGNOSIS — S32029A Unspecified fracture of second lumbar vertebra, initial encounter for closed fracture: Secondary | ICD-10-CM | POA: Diagnosis present

## 2023-04-03 DIAGNOSIS — Z79890 Hormone replacement therapy (postmenopausal): Secondary | ICD-10-CM | POA: Diagnosis not present

## 2023-04-03 DIAGNOSIS — Z91010 Allergy to peanuts: Secondary | ICD-10-CM | POA: Diagnosis not present

## 2023-04-03 DIAGNOSIS — Z888 Allergy status to other drugs, medicaments and biological substances status: Secondary | ICD-10-CM | POA: Diagnosis not present

## 2023-04-03 DIAGNOSIS — Z881 Allergy status to other antibiotic agents status: Secondary | ICD-10-CM | POA: Diagnosis not present

## 2023-04-03 DIAGNOSIS — E039 Hypothyroidism, unspecified: Secondary | ICD-10-CM | POA: Diagnosis present

## 2023-04-03 DIAGNOSIS — Z66 Do not resuscitate: Secondary | ICD-10-CM | POA: Diagnosis present

## 2023-04-03 MED ORDER — SENNOSIDES-DOCUSATE SODIUM 8.6-50 MG PO TABS
1.0000 | ORAL_TABLET | Freq: Two times a day (BID) | ORAL | Status: DC
  Administered 2023-04-03 – 2023-04-06 (×6): 1 via ORAL
  Filled 2023-04-03 (×7): qty 1

## 2023-04-03 MED ORDER — BISACODYL 10 MG RE SUPP: 10.0000 mg | Freq: Every day | RECTAL | Status: DC | PRN

## 2023-04-03 MED ORDER — POLYETHYLENE GLYCOL 3350 17 G PO PACK
17.0000 g | PACK | Freq: Every day | ORAL | Status: DC | PRN
  Administered 2023-04-04: 17 g via ORAL
  Filled 2023-04-03 (×2): qty 1

## 2023-04-03 NOTE — Progress Notes (Signed)
PROGRESS NOTE    Paul Hopkins  JYN:[id number] DOB: [birth year] DOA: 04/01/2023 PCP: Joycelyn Man, NP   Brief Narrative:  89 y.o. male with medical history significant of bradycardia, history of unspecified CVA, vascular dementia, delusional disorder, hypothyroidism, Mnire's disease, nonpressure chronic ulcer of buttock, osteoarthritis presented with hip pain after a fall.  Workup revealed L2-L3 compression fractures, new since 2021 but age indeterminate.  Right hip x-ray did not show any fracture or dislocation.  He was admitted for intractable pain.  Assessment & Plan:   Close lumbar compression fracture after a mechanical fall -Workup revealed L2-L3 compression fractures, new since 2021 but age indeterminate.  Right hip x-ray did not show any fracture or dislocation.   -ED provider discussed with neurosurgery on-call/Dr. Danielle Dess who recommended conservative management and outpatient follow-up with neurosurgery. -Continue back brace/lumbar corsett -Pain management.  Fall precautions. -PT recommended SNF placement.  TOC consult.  Vascular dementia -Delirium precautions.  Fall precautions.  Continue Abilify.  Hypothyroidism -continue levothyroxine  History of unspecified CVA -Continue aspirin.  Outpatient follow-up with neurology.   DVT prophylaxis: Lovenox Code Status: DNR Family Communication: None at bedside Disposition Plan: Status is: Observation The patient will require care spanning > 2 midnights and should be moved to inpatient because: Of severity of illness.  Need for SNF placement  Consultants: ED provider spoke to Dr. Danielle Dess on admission Procedures: None  Antimicrobials: None   Subjective: Patient seen and examined at bedside.  Poor historian.  Denies worsening back pain.  No fever, agitation or vomiting reported. Objective: Vitals:   04/02/23 0850 04/02/23 1333 04/02/23 2242 04/03/23 0559  BP: (!) 154/73 131/78 (!) 141/82 (!) 144/72  Pulse:  65 65 69 66  Resp: 19 20 15 15   Temp: 98 F (36.7 C) 97.6 F (36.4 C) 98.1 F (36.7 C) 98.1 F (36.7 C)  TempSrc: Oral Oral    SpO2: 94% 98% 94% 95%  Weight:      Height:        Intake/Output Summary (Last 24 hours) at 04/03/2023 0800 Last data filed at 04/03/2023 0600 Gross per 24 hour  Intake 690 ml  Output 1200 ml  Net -510 ml   Filed Weights   04/01/23 0814  Weight: 68 kg    Examination:  General: Currently on room air.  No distress.  Awake; extremely slow to respond.  Poor historian.  Slightly confused. respiratory: Bilateral decreased breath sounds at bases with scattered crackles CVS: Currently rate controlled; S1-S2 heard  abdominal: Soft, nontender, slightly distended, no organomegaly; normal bowel sounds are heard  extremities: Trace lower extremity edema; no clubbing.     Data Reviewed: I have personally reviewed following labs and imaging studies  CBC: Recent Labs  Lab 04/01/23 1111  WBC 9.3  NEUTROABS 7.9*  HGB 13.8  HCT 41.3  MCV 92.4  PLT 183   Basic Metabolic Panel: Recent Labs  Lab 04/01/23 1111  NA 135  K 4.1  CL 103  CO2 26  GLUCOSE 117*  BUN 16  CREATININE 0.53*  CALCIUM 8.7*  MG 2.0  PHOS 3.0   GFR: Estimated Creatinine Clearance: 62.6 mL/min (A) (by C-G formula based on SCr of 0.53 mg/dL (L)). Liver Function Tests: Recent Labs  Lab 04/01/23 1111  AST 26  ALT 16  ALKPHOS 89  BILITOT 1.2  PROT 5.7*  ALBUMIN 3.6   No results for input(s): "LIPASE", "AMYLASE" in the last 168 hours. No results for input(s): "AMMONIA" in the last 168 hours.  Coagulation Profile: No results for input(s): "INR", "PROTIME" in the last 168 hours. Cardiac Enzymes: No results for input(s): "CKTOTAL", "CKMB", "CKMBINDEX", "TROPONINI" in the last 168 hours. BNP (last 3 results) No results for input(s): "PROBNP" in the last 8760 hours. HbA1C: No results for input(s): "HGBA1C" in the last 72 hours. CBG: No results for input(s): "GLUCAP" in the  last 168 hours. Lipid Profile: No results for input(s): "CHOL", "HDL", "LDLCALC", "TRIG", "CHOLHDL", "LDLDIRECT" in the last 72 hours. Thyroid Function Tests: No results for input(s): "TSH", "T4TOTAL", "FREET4", "T3FREE", "THYROIDAB" in the last 72 hours. Anemia Panel: No results for input(s): "VITAMINB12", "FOLATE", "FERRITIN", "TIBC", "IRON", "RETICCTPCT" in the last 72 hours. Sepsis Labs: No results for input(s): "PROCALCITON", "LATICACIDVEN" in the last 168 hours.  No results found for this or any previous visit (from the past 240 hour(s)).       Radiology Studies: DG Lumbar Spine Complete  Result Date: 04/01/2023 CLINICAL DATA:  Fall. EXAM: LUMBAR SPINE - COMPLETE 4+ VIEW COMPARISON:  11/27/2019 FINDINGS: L2 and L3 compression fractures without detected acute feature. Height loss is up to moderate at L3. There is lumbar spine degeneration especially affecting facets at L3-4 and below with mild anterolisthesis at L4-5. Subjective osteopenia. No aggressive bone lesion. IMPRESSION: 1. L2 and L3 compression fractures, new since 2021 abdominal CT but age indeterminate. 2. Lumbar spine degeneration especially affecting lower facets with L4-5 anterolisthesis. Electronically Signed   By: Tiburcio Pea M.D.   On: 04/01/2023 09:16   DG Hip Unilat W or Wo Pelvis 2-3 Views Right  Result Date: 04/01/2023 CLINICAL DATA:  Fall. EXAM: DG HIP (WITH OR WITHOUT PELVIS) 3V RIGHT COMPARISON:  None Available. FINDINGS: There is no evidence of hip fracture or dislocation. There is no evidence of arthropathy or other focal bone abnormality. Subjective osteopenia. IMPRESSION: Negative for fracture or dislocation. Electronically Signed   By: Tiburcio Pea M.D.   On: 04/01/2023 09:00        Scheduled Meds:  ARIPiprazole  10 mg Oral Daily   aspirin EC  81 mg Oral Daily   enoxaparin (LOVENOX) injection  40 mg Subcutaneous Q24H   levothyroxine  88 mcg Oral QAC breakfast   Continuous  Infusions:        Glade Lloyd, MD [facility] 04/03/2023, 8:00 AM

## 2023-04-03 NOTE — [place] FL2 (Signed)
[place] MEDICAID FL2 LEVEL OF CARE FORM     IDENTIFICATION  Patient Name: Paul Hopkins Birthdate: 10/04/1933 Sex: male Admission Date (Current Location): 04/01/2023  [facility] and [place] Number:  Producer, television/film/video and Address:  [facility],  [address] [place], [place] [address]      Provider Number: [id number]  Attending Physician Name and Address:  Glade Lloyd, MD  Relative Name and Phone Number:  Bow Ader (daughter) Ph: [phone]    Current Level of Care: Hospital Recommended Level of Care: Skilled Nursing Facility Prior Approval Number:    Date Approved/Denied:   PASRR Number: [id number]  Discharge Plan: SNF    Current Diagnoses: Patient Active Problem List   Diagnosis Date Noted   Lumbar stress fracture 04/03/2023   Lumbar compression fracture, closed, initial encounter (HCC) 04/01/2023   Hypoproteinemia (HCC) 04/01/2023   Sinus bradycardia 05/19/2021   Pseudomonas infection 11/28/2019   Vascular dementia (HCC) 11/28/2019   Hypothyroidism (acquired) 11/28/2019   Palliative care by specialist    Goals of care, counseling/discussion    DNR (do not resuscitate)    Sepsis (HCC) 11/27/2019    Orientation RESPIRATION BLADDER Height & Weight     Self, Time, Situation, Place  Normal Incontinent Weight: 150 lb (68 kg) Height:  6' (182.9 cm)  BEHAVIORAL SYMPTOMS/MOOD NEUROLOGICAL BOWEL NUTRITION STATUS      Continent Diet (Heart healthy diet)  AMBULATORY STATUS COMMUNICATION OF NEEDS Skin   Limited Assist Verbally Other (Comment) (Erythema: buttocks)                       Personal Care Assistance Level of Assistance  Bathing, Feeding, Dressing Bathing Assistance: Limited assistance Feeding assistance: Limited assistance Dressing Assistance: Limited assistance     Functional Limitations Info  Sight, Hearing, Speech Sight Info: Impaired Hearing Info: Impaired Speech Info: Adequate    SPECIAL CARE FACTORS FREQUENCY   PT (By licensed PT), OT (By licensed OT)     PT Frequency: 5x's/week OT Frequency: 5x's/week            Contractures Contractures Info: Not present    Additional Factors Info  Code Status, Allergies, Psychotropic Code Status Info: DNR Allergies Info: Amoxicillin, Banana, Bean Pod Extract, Blackberry (Rubus Fruticosus), Broccoli (Brassica Oleracea), Carrot Oil, Ciprofloxacin, Citric Acid, Citrus, Corn-containing Products, Egg (Egg-derived Products), Milk-related Compounds, Other, Peanut-containing Drug Products, Raspberry, Salmon (Fish Oil), Salmon-oats-squash (Alitraq), Strawberry Extract, Sulfa Antibiotics, Trimethoprim, Whey Psychotropic Info: Abilify         Current Medications (04/03/2023):  This is the current hospital active medication list Current Facility-Administered Medications  Medication Dose Route Frequency Provider Last Rate Last Admin   acetaminophen (TYLENOL) tablet 650 mg  650 mg Oral Q6H PRN Bobette Mo, MD   650 mg at 04/02/23 1342   Or   acetaminophen (TYLENOL) suppository 650 mg  650 mg Rectal Q6H PRN Bobette Mo, MD       ARIPiprazole (ABILIFY) tablet 10 mg  10 mg Oral Daily Glade Lloyd, MD   10 mg at 04/03/23 0935   aspirin EC tablet 81 mg  81 mg Oral Daily Bobette Mo, MD   81 mg at 04/03/23 0935   enoxaparin (LOVENOX) injection 40 mg  40 mg Subcutaneous Q24H Bobette Mo, MD   40 mg at 04/02/23 1343   levothyroxine (SYNTHROID) tablet 88 mcg  88 mcg Oral QAC breakfast Bobette Mo, MD   88 mcg at 04/03/23 [id number]  ondansetron (ZOFRAN) tablet 4 mg  4 mg Oral Q6H PRN Bobette Mo, MD       Or   ondansetron [facility]) injection 4 mg  4 mg Intravenous Q6H PRN Bobette Mo, MD       oxyCODONE (Oxy IR/ROXICODONE) immediate release tablet 5 mg  5 mg Oral Q6H PRN Bobette Mo, MD         Discharge Medications: Please see discharge summary for a list of discharge medications.  Relevant Imaging  Results:  Relevant Lab Results:   Additional Information SSN: [id number]  Ewing Schlein, LCSW

## 2023-04-03 NOTE — TOC Initial Note (Signed)
Transition of Care [facility]) - Initial/Assessment Note   Patient Details  Name: Paul Hopkins MRN: [id number] Date of Birth: 08/26/1933  Transition [facility]) CM/SW Contact:    Ewing Schlein, LCSW Phone Number: 04/03/2023, 9:33 AM  Clinical Narrative: PT evaluation recommended SNF. CSW spoke with daughter, Paul Hopkins, regarding recommendation. Daughter expressed concern regarding the quality of SNFs in the area, but is agreeable to CSW starting the bed search process.  FL2 done; PASRR received. Initial referral faxed out. TOC awaiting bed offers.                 Expected Discharge Plan: Skilled Nursing Facility Barriers to Discharge: Continued Medical Work up  Patient Goals and CMS Choice Patient states their goals for this hospitalization and ongoing recovery are:: Get better CMS Medicare.gov Compare Post Acute Care list provided to:: Patient Represenative (must comment) Choice offered to / list presented to : Adult Children  Expected Discharge Plan and Services In-house Referral: Clinical Social Work Living arrangements for the past 2 months: Independent Living Facility           DME Arranged: N/A DME Agency: NA  Prior Living Arrangements/Services Living arrangements for the past 2 months: Independent Living Facility Patient language and need for interpreter reviewed:: Yes Do you feel safe going back to the place where you live?: Yes      Need for Family Participation in Patient Care: Yes (Comment) (Patient has dementia.) Care giver support system in place?: Yes (comment) Criminal Activity/Legal Involvement Pertinent to Current Situation/Hospitalization: No - Comment as needed  Activities of Daily Living ADL Screening (condition at time of admission) Independently performs ADLs?: Yes (appropriate for developmental age) Is the patient deaf or have difficulty hearing?: Yes Does the patient have difficulty seeing, even when wearing glasses/contacts?: No Does the patient  have difficulty concentrating, remembering, or making decisions?: No  Permission Sought/Granted Permission sought to share information with : Facility Industrial/product designer granted to share information with : Yes, Verbal Permission Granted Permission granted to share info w AGENCY: SNFs  Emotional Assessment Orientation: : Oriented to Self, Oriented to Place, Oriented to  Time, Oriented to Situation Alcohol / Substance Use: Not Applicable Psych Involvement: No (comment)  Admission diagnosis:  Lumbar compression fracture, closed, initial encounter (HCC) [S32.000A] Compression fracture of lumbar vertebra, unspecified lumbar vertebral level, initial encounter (HCC) [S32.000A] Lumbar stress fracture [W09.81XB] Patient Active Problem List   Diagnosis Date Noted   Lumbar stress fracture 04/03/2023   Lumbar compression fracture, closed, initial encounter (HCC) 04/01/2023   Hypoproteinemia (HCC) 04/01/2023   Sinus bradycardia 05/19/2021   Pseudomonas infection 11/28/2019   Vascular dementia (HCC) 11/28/2019   Hypothyroidism (acquired) 11/28/2019   Palliative care by specialist    Goals of care, counseling/discussion    DNR (do not resuscitate)    Sepsis (HCC) 11/27/2019   PCP:  Joycelyn Man, NP Pharmacy:   [facility] [place], [place] - [address] [place] [address] Phone: [phone] Fax: [phone]  [facility] [address] [place] [address] Phone: [phone] Fax: [phone]  Social Determinants of Health (SDOH) Social History: SDOH Screenings   Food Insecurity: Patient Declined (04/01/2023)  Housing: High Risk (04/01/2023)  Transportation Needs: Patient Declined (04/01/2023)  Utilities: Patient Declined (04/01/2023)  Tobacco Use: Low Risk  (04/01/2023)   SDOH Interventions: Housing Interventions: Inpatient TOC, Intervention Not Indicated (Patient is  from [facility]. There is no housing  issue.)  Readmission Risk Interventions     No data to display

## 2023-04-04 DIAGNOSIS — S32000A Wedge compression fracture of unspecified lumbar vertebra, initial encounter for closed fracture: Secondary | ICD-10-CM | POA: Diagnosis not present

## 2023-04-04 MED ORDER — PRESERVISION AREDS 2 PO CAPS: 2.0000 | ORAL_CAPSULE | Freq: Every day | ORAL | Status: DC

## 2023-04-04 MED ORDER — ADULT MULTIVITAMIN W/MINERALS CH
ORAL_TABLET | Freq: Every day | ORAL | Status: DC
  Administered 2023-04-04 – 2023-04-06 (×2): 1 via ORAL
  Filled 2023-04-04 (×3): qty 1

## 2023-04-04 MED ORDER — PROSIGHT PO TABS
1.0000 | ORAL_TABLET | Freq: Every day | ORAL | Status: DC
  Administered 2023-04-04 – 2023-04-06 (×3): 1 via ORAL
  Filled 2023-04-04 (×3): qty 1

## 2023-04-04 MED ORDER — RISAQUAD PO CAPS
1.0000 | ORAL_CAPSULE | Freq: Two times a day (BID) | ORAL | Status: DC
  Administered 2023-04-04 – 2023-04-06 (×5): 1 via ORAL
  Filled 2023-04-04 (×5): qty 1

## 2023-04-04 MED ORDER — VITAMIN D 25 MCG (1000 UNIT) PO TABS
2000.0000 [IU] | ORAL_TABLET | Freq: Every day | ORAL | Status: DC
  Administered 2023-04-04 – 2023-04-06 (×3): 2000 [IU] via ORAL
  Filled 2023-04-04 (×3): qty 2

## 2023-04-04 NOTE — Progress Notes (Signed)
Physical Therapy Treatment Patient Details Name: Paul Hopkins MRN: [id number] DOB: Jun 06, 1934 Today's Date: 04/04/2023   History of Present Illness Pt admitted from [facility] s/p fall with imaging revealing Compression fxs at L2 and L3 but of indeterminate age.  Pt with hx of dementia, CVA, delusional disorder, and non-pressure chronic ulcer of buttock.    PT Comments  Pt is pleasant and cooperative, HOH with hx of dementia however follows one step commands consistently. Pt tol incr gait distance this session, no c/o pain or incr pain with mobility. D/c plan remains appropriate, will benefit from continued PT post acute     If plan is discharge home, recommend the following: A lot of help with walking and/or transfers;A lot of help with bathing/dressing/bathroom;Assistance with cooking/housework;Assist for transportation;Help with stairs or ramp for entrance   Can travel by private vehicle     No  Equipment Recommendations  None recommended by PT    Recommendations for Other Services       Precautions / Restrictions Precautions Precautions: Fall Required Braces or Orthoses: Spinal Brace Spinal Brace: Lumbar corset Restrictions Weight Bearing Restrictions: No     Mobility  Bed Mobility Overal bed mobility: Needs Assistance Bed Mobility: Rolling, Sidelying to Sit Rolling: Mod assist Sidelying to sit: Mod assist       General bed mobility comments: cues for log roll technique-assist to progress LEs off bed and elevate trunk; increased assist and use of bed pad to scoot fwd to EOB sitting    Transfers Overall transfer level: Needs assistance Equipment used: Rolling walker (2 wheels) Transfers: Sit to/from Stand Sit to Stand: Min assist, Mod assist, +2 safety/equipment, From elevated surface           General transfer comment: cues for transition position on EOB and use of UEs to self assist.  Physical assist to bring wt up and fwd and to  balance in standing with RW    Ambulation/Gait Ambulation/Gait assistance: Min assist, Mod assist, +2 safety/equipment (chair follow) Gait Distance (Feet): 35 Feet Assistive device: Rolling walker (2 wheels) Gait Pattern/deviations: Step-to pattern, Decreased step length - right, Decreased step length - left, Trunk flexed, Narrow base of support Gait velocity: decr     General Gait Details: multi-modal cues for trunk and cervical ext as able, post drift at times with assist for balance and RW management   Stairs             Wheelchair Mobility     Tilt Bed    Modified Rankin (Stroke Patients Only)       Balance Overall balance assessment: Needs assistance Sitting-balance support: No upper extremity supported, Feet supported Sitting balance-Leahy Scale: Fair     Standing balance support: Bilateral upper extremity supported Standing balance-Leahy Scale: Poor                              Cognition Arousal: Alert Behavior During Therapy: WFL for tasks assessed/performed Overall Cognitive Status: History of cognitive impairments - at baseline                                          Exercises      General Comments        Pertinent Vitals/Pain Pain Assessment Pain Assessment: No/denies pain    Home Living  Prior Function            PT Goals (current goals can now be found in the care plan section) Acute Rehab PT Goals Patient Stated Goal: Get out of bed PT Goal Formulation: With patient Time For Goal Achievement: 04/16/23 Potential to Achieve Goals: Fair Progress towards PT goals: Progressing toward goals    Frequency    Min 1X/week      PT Plan      Co-evaluation              AM-PAC PT "6 Clicks" Mobility   Outcome Measure  Help needed turning from your back to your side while in a flat bed without using bedrails?: A Lot Help needed moving from lying on your back  to sitting on the side of a flat bed without using bedrails?: A Lot Help needed moving to and from a bed to a chair (including a wheelchair)?: A Lot Help needed standing up from a chair using your arms (e.g., wheelchair or bedside chair)?: A Lot Help needed to walk in hospital room?: A Lot Help needed climbing 3-5 steps with a railing? : Total 6 Click Score: 11    End of Session Equipment Utilized During Treatment: Gait belt Activity Tolerance: Patient tolerated treatment well Patient left: in chair;with call bell/phone within reach;with chair alarm set Nurse Communication: Mobility status PT Visit Diagnosis: Muscle weakness (generalized) (M62.81);History of falling (Z91.81);Difficulty in walking, not elsewhere classified (R26.2);Unsteadiness on feet (R26.81)     Time: 1610-9604 PT Time Calculation (min) (ACUTE ONLY): 13 min  Charges:    $Gait Training: 8-22 mins PT General Charges $$ ACUTE PT VISIT: 1 Visit                     Candido Flott, PT  Acute Rehab Dept [facility]) [phone]  04/04/2023    [facility] 04/04/2023, 11:59 AM

## 2023-04-04 NOTE — Progress Notes (Signed)
PROGRESS NOTE    Paul Hopkins  YQM:[id number] DOB: 1934-04-23 DOA: 04/01/2023 PCP: Joycelyn Man, NP   Brief Narrative:  89 y.o. male with medical history significant of bradycardia, history of unspecified CVA, vascular dementia, delusional disorder, hypothyroidism, Mnire's disease, nonpressure chronic ulcer of buttock, osteoarthritis presented with hip pain after a fall.  Workup revealed L2-L3 compression fractures, new since 2021 but age indeterminate.  Right hip x-ray did not show any fracture or dislocation.  He was admitted for intractable pain.  PT recommending SNF placement.  Currently stable for discharge to SNF.  Assessment & Plan:   Close lumbar compression fracture after a mechanical fall -Workup revealed L2-L3 compression fractures, new since 2021 but age indeterminate.  Right hip x-ray did not show any fracture or dislocation.   -ED provider discussed with neurosurgery on-call/Dr. Danielle Dess who recommended conservative management and outpatient follow-up with neurosurgery. -Continue back brace/lumbar corsett -Pain management.  Fall precautions. -PT recommended SNF placement.  TOC following  Vascular dementia -Delirium precautions.  Fall precautions.  Continue Abilify.  Hypothyroidism -continue levothyroxine  History of unspecified CVA -Continue aspirin.  Outpatient follow-up with neurology.   DVT prophylaxis: Lovenox Code Status: DNR Family Communication: Daughter on 04/02/2023 on phone Disposition Plan: Status is: inpatient because: Of severity of illness.  Need for SNF placement  Consultants: ED provider spoke to Dr. Danielle Dess on admission Procedures: None  Antimicrobials: None   Subjective: Patient seen and examined at bedside.  Poor historian.  No agitation, seizures, vomiting reported.   Objective: Vitals:   04/03/23 0559 04/03/23 1426 04/03/23 2219 04/04/23 0450  BP: (!) 144/72 129/65 139/69 (!) 154/64  Pulse: 66 68 (!) 57 64  Resp: 15 14  17 17   Temp: 98.1 F (36.7 C) 98.1 F (36.7 C) (!) 97.5 F (36.4 C) 98.5 F (36.9 C)  TempSrc:  Oral Oral Oral  SpO2: 95% 95% 96% 96%  Weight:      Height:        Intake/Output Summary (Last 24 hours) at 04/04/2023 0820 Last data filed at 04/04/2023 0600 Gross per 24 hour  Intake 480 ml  Output 500 ml  Net -20 ml   Filed Weights   04/01/23 0814  Weight: 68 kg    Examination:  General: No acute distress.  On room air.  Awake; still slow to respond.  Poor historian.  Slightly confused. respiratory: Decreased breath sounds at bases bilaterally with some crackles CVS: S1 and S2 are heard; mild intermittent bradycardia present abdominal: Soft, nontender, distended mildly, no organomegaly; bowel sounds normally heard  extremities: No cyanosis; mild lower extremity edema present   Data Reviewed: I have personally reviewed following labs and imaging studies  CBC: Recent Labs  Lab 04/01/23 1111  WBC 9.3  NEUTROABS 7.9*  HGB 13.8  HCT 41.3  MCV 92.4  PLT 183   Basic Metabolic Panel: Recent Labs  Lab 04/01/23 1111  NA 135  K 4.1  CL 103  CO2 26  GLUCOSE 117*  BUN 16  CREATININE 0.53*  CALCIUM 8.7*  MG 2.0  PHOS 3.0   GFR: Estimated Creatinine Clearance: 62.6 mL/min (A) (by C-G formula based on SCr of 0.53 mg/dL (L)). Liver Function Tests: Recent Labs  Lab 04/01/23 1111  AST 26  ALT 16  ALKPHOS 89  BILITOT 1.2  PROT 5.7*  ALBUMIN 3.6   No results for input(s): "LIPASE", "AMYLASE" in the last 168 hours. No results for input(s): "AMMONIA" in the last 168 hours. Coagulation Profile: No  results for input(s): "INR", "PROTIME" in the last 168 hours. Cardiac Enzymes: No results for input(s): "CKTOTAL", "CKMB", "CKMBINDEX", "TROPONINI" in the last 168 hours. BNP (last 3 results) No results for input(s): "PROBNP" in the last 8760 hours. HbA1C: No results for input(s): "HGBA1C" in the last 72 hours. CBG: No results for input(s): "GLUCAP" in the last 168  hours. Lipid Profile: No results for input(s): "CHOL", "HDL", "LDLCALC", "TRIG", "CHOLHDL", "LDLDIRECT" in the last 72 hours. Thyroid Function Tests: No results for input(s): "TSH", "T4TOTAL", "FREET4", "T3FREE", "THYROIDAB" in the last 72 hours. Anemia Panel: No results for input(s): "VITAMINB12", "FOLATE", "FERRITIN", "TIBC", "IRON", "RETICCTPCT" in the last 72 hours. Sepsis Labs: No results for input(s): "PROCALCITON", "LATICACIDVEN" in the last 168 hours.  No results found for this or any previous visit (from the past 240 hour(s)).       Radiology Studies: No results found.      Scheduled Meds:  ARIPiprazole  10 mg Oral Daily   aspirin EC  81 mg Oral Daily   enoxaparin (LOVENOX) injection  40 mg Subcutaneous Q24H   levothyroxine  88 mcg Oral QAC breakfast   senna-docusate  1 tablet Oral BID   Continuous Infusions:        Glade Lloyd, MD [facility] 04/04/2023, 8:20 AM

## 2023-04-04 NOTE — TOC Progression Note (Addendum)
Transition of Care [facility]) - Progression Note   Patient Details  Name: Paul Hopkins MRN: [id number] Date of Birth: [birth year]  Transition of Care [facility]) CM/SW Contact  Ewing Schlein, LCSW Phone Number: 04/04/2023, 1:56 PM  Clinical Narrative: CSW provided patient's daughter, Paul Hopkins, with bed offers for patient with Medicare star ratings:  [facility] [address] [place], [place] [address] [phone] Overall rating ??? Average  [facility] [address] [place], [place] [address] [phone] Overall rating ???? Above average  [facility] [address] [place], [place] [address] [phone] Overall rating ????? Much above average  [facility] [address] [place], [place] [address] [phone] Overall rating ???? Above average  Daughter tentatively agreeable to [facility], but would like the referral sent to [facility] for review as well. CSW made referral to [facility], which was accepted:  [facility] [address] [place], [place] [address] [phone] Overall rating?? Below average  CSW spoke with [place] in admissions and [place] and confirmed the facility will be able to manage the patient's dietary restrictions. TOC awaiting final bed choice from daughter.  Addendum: CSW received call from daughter and Paul Hopkins has been selected. CSW notified [place] at [place].  Expected Discharge Plan: Skilled Nursing Facility Barriers to Discharge: Continued Medical Work up  Expected Discharge Plan and Services In-house Referral: Clinical Social Work Living arrangements for the past 2 months: Independent Living Facility           DME Arranged: N/A DME Agency: NA  Social Determinants of Health (SDOH) Interventions SDOH Screenings   Food Insecurity: Patient Declined (04/01/2023)  Housing: High Risk  (04/01/2023)  Transportation Needs: Patient Declined (04/01/2023)  Utilities: Patient Declined (04/01/2023)  Tobacco Use: Low Risk  (04/01/2023)   Readmission Risk Interventions     No data to display

## 2023-04-05 DIAGNOSIS — S32000A Wedge compression fracture of unspecified lumbar vertebra, initial encounter for closed fracture: Secondary | ICD-10-CM | POA: Diagnosis not present

## 2023-04-05 MED ORDER — ACETAMINOPHEN 325 MG PO TABS: 650.0000 mg | ORAL_TABLET | Freq: Four times a day (QID) | ORAL | Status: AC | PRN

## 2023-04-05 MED ORDER — SENNOSIDES-DOCUSATE SODIUM 8.6-50 MG PO TABS: 1.0000 | ORAL_TABLET | Freq: Two times a day (BID) | ORAL | Status: DC

## 2023-04-05 NOTE — Progress Notes (Signed)
Initial Nutrition Assessment  DOCUMENTATION CODES:   Not applicable  INTERVENTION:   Liberalize diet   MVI po daily   Pt at high refeed risk; recommend monitor potassium, magnesium and phosphorus labs daily until stable  Daily weights  Pt at high risk for nutrient deficiencies r/t his numerous food allergies. Recommend follow up with PCP to check thiamine, B12, folate, iron, zinc, copper and vitamins D, A, E, & K.   NUTRITION DIAGNOSIS:   Increased nutrient needs related to acute illness as evidenced by estimated needs.  GOAL:   Patient will meet greater than or equal to 90% of their needs  MONITOR:   PO intake, Supplement acceptance, Labs, Weight trends, Skin, I & O's  REASON FOR ASSESSMENT:   Consult Assessment of nutrition requirement/status, Diet education  ASSESSMENT:   89 y/o male with h/o dementia, stroke, delusional disorder, hypothyroidism, Mnire's disease and IBS-D who is admitted with compression fracture after fall.  RD working remotely.  RD unable to reach pt or pt's daughter by phone today after numerous attempts. Per chart review, it appears pt is having difficulty with ordering foods that he can eat secondary to numerous listed food allergies (bean, broccoli, carrots, citric acid/citrus, corn, eggs, milk/whey, peanuts, salmon, oats, squash, banana, blackberry, raspberry and strawberries). Reactions listed are diarrhea, hip pain and causes meniere's flare; RD suspects these are mainly food intolerances and not true allergies. Pt does have a h/o chronic diarrhea and is noted to have IBS-D. RD unsure of any previous abdominal surgeries. Pt currently ordered for a heart healthy diet; RD will liberalize to a regular diet to allow pt more food options. Per chart, pt is documented to be eating 100% of meals. It appears that pt does take a daily MVI at home. Pt with reported history of dysphagia and poor dentition; pt eats a mechanical soft diet at home. RD will not  order a mechanical soft diet here as this will further limit what pt can order. Will also hold on supplements for now as most supplements contain whey or pea protein and pt is eating well. There is no recent weight history in chart to determine if pt has had any significant changes. Pt is at high risk for malnutrition secondary to his restrictive diet and advanced age. RD will obtain nutrition related exam at follow up.   Pt at high risk for nutrient deficiencies r/t his numerous food allergies. Recommend follow up with PCP to check thiamine, B12, folate, iron, zinc, copper and vitamins D, A, E, & K.      Medications reviewed and include: risaquad, aspirin, D3, lovenox, synthroid, prosight, MVI, senokot  Labs reviewed: K 4.1 wnl, P 3.0 wnl, Mg 2.0 wnl   NUTRITION - FOCUSED PHYSICAL EXAM: Unable to perform at this time   Diet Order:   Diet Order             Diet regular Room service appropriate? No; Fluid consistency: Thin  Diet effective now                  EDUCATION NEEDS:   Not appropriate for education at this time  Skin:  Skin Assessment: Reviewed RN Assessment  Last BM:  10/22- type 7  Height:   Ht Readings from Last 1 Encounters:  04/01/23 6' (1.829 m)    Weight:   Wt Readings from Last 1 Encounters:  04/01/23 68 kg    Ideal Body Weight:  80.9 kg  BMI:  Body mass index is  20.34 kg/m.  Estimated Nutritional Needs:   Kcal:  1800-2100kcal/day  Protein:  90-105g/day  Fluid:  1.7-2.0L/day  Betsey Holiday MS, RD, LDN Please refer to [facility] for RD and/or RD on-call/weekend/after hours pager

## 2023-04-05 NOTE — Plan of Care (Signed)
  Problem: Coping: Goal: Level of anxiety will decrease Outcome: Progressing   Problem: Safety: Goal: Ability to remain free from injury will improve Outcome: Progressing   Problem: Pain Managment: Goal: General experience of comfort will improve Outcome: Progressing   

## 2023-04-05 NOTE — Plan of Care (Signed)
  Problem: Education: Goal: Knowledge of General Education information will improve Description: Including pain rating scale, medication(s)/side effects and non-pharmacologic comfort measures Outcome: Progressing   Problem: Activity: Goal: Risk for activity intolerance will decrease Outcome: Progressing   Problem: Pain Managment: Goal: General experience of comfort will improve Outcome: Progressing   

## 2023-04-05 NOTE — Progress Notes (Signed)
Physical Therapy Treatment Patient Details Name: Paul Hopkins MRN: [id number] DOB: 1933-10-04 Today's Date: 04/05/2023   History of Present Illness Pt admitted from [facility] s/p fall with imaging revealing Compression fxs at L2 and L3 but of indeterminate age.  Pt with hx of dementia, CVA, delusional disorder, and non-pressure chronic ulcer of buttock.    PT Comments  Pt continues to progress steadily.  Amb hallway distance and tolerated LE exercises as well as repeated STS for strengthening and endurance. Pt requested to return to bed after session.  D/c plan remains appropriate. Continue PT POC    If plan is discharge home, recommend the following: A lot of help with walking and/or transfers;A lot of help with bathing/dressing/bathroom;Assistance with cooking/housework;Assist for transportation;Help with stairs or ramp for entrance   Can travel by private vehicle     No  Equipment Recommendations  None recommended by PT    Recommendations for Other Services       Precautions / Restrictions Precautions Precautions: Fall Required Braces or Orthoses: Spinal Brace Spinal Brace: Lumbar corset Restrictions Weight Bearing Restrictions: No     Mobility  Bed Mobility Overal bed mobility: Needs Assistance Bed Mobility: Sit to Supine       Sit to supine: Mod assist   General bed mobility comments: assist to lift LEs on to bed incr time, unable to transition to s/l prior to  return to supine    Transfers Overall transfer level: Needs assistance Equipment used: Rolling walker (2 wheels) Transfers: Sit to/from Stand, Bed to chair/wheelchair/BSC Sit to Stand: Mod assist, Max assist   Step pivot transfers: Min assist, Mod assist       General transfer comment: STS repeated x3 for strengthening; cues for use of UEs to self assist.  Physical assist to bring wt up and fwd and to balance in standing with RW; posterior bias during transition with incr  time needed to brign COG over BOS    Ambulation/Gait Ambulation/Gait assistance: Min assist Gait Distance (Feet): 50 Feet Assistive device: Rolling walker (2 wheels) Gait Pattern/deviations: Step-to pattern, Decreased step length - right, Decreased step length - left, Trunk flexed, Narrow base of support Gait velocity: decr     General Gait Details: multi-modal cues for trunk and cervical ext as able, post drift at times with intermittent assist for balance and RW management   Stairs             Wheelchair Mobility     Tilt Bed    Modified Rankin (Stroke Patients Only)       Balance           Standing balance support: During functional activity, Reliant on assistive device for balance, Bilateral upper extremity supported Standing balance-Leahy Scale: Poor Standing balance comment: reliant on device and external assist                            Cognition Arousal: Alert Behavior During Therapy: WFL for tasks assessed/performed Overall Cognitive Status: History of cognitive impairments - at baseline                                 General Comments: follows one step commands, responses slightly delayed partially d/t HOH even with hearing aids        Exercises General Exercises - Lower Extremity Ankle Circles/Pumps: AROM, Both, 5 reps Heel Slides: AROM,  Both, 5 reps Hip Flexion/Marching: AROM, Both, 5 reps, Standing    General Comments        Pertinent Vitals/Pain Pain Assessment Pain Assessment: No/denies pain    Home Living                          Prior Function            PT Goals (current goals can now be found in the care plan section) Acute Rehab PT Goals PT Goal Formulation: With patient Time For Goal Achievement: 04/16/23 Potential to Achieve Goals: Fair Progress towards PT goals: Progressing toward goals    Frequency    Min 1X/week      PT Plan      Co-evaluation               AM-PAC PT "6 Clicks" Mobility   Outcome Measure  Help needed turning from your back to your side while in a flat bed without using bedrails?: A Lot Help needed moving from lying on your back to sitting on the side of a flat bed without using bedrails?: A Lot Help needed moving to and from a bed to a chair (including a wheelchair)?: A Lot Help needed standing up from a chair using your arms (e.g., wheelchair or bedside chair)?: A Lot Help needed to walk in hospital room?: A Lot Help needed climbing 3-5 steps with a railing? : Total 6 Click Score: 11    End of Session Equipment Utilized During Treatment: Gait belt;Back brace Activity Tolerance: Patient tolerated treatment well Patient left: in bed;with call bell/phone within reach;with bed alarm set Nurse Communication: Mobility status PT Visit Diagnosis: Muscle weakness (generalized) (M62.81);History of falling (Z91.81);Difficulty in walking, not elsewhere classified (R26.2);Unsteadiness on feet (R26.81)     Time: 1525-1550 PT Time Calculation (min) (ACUTE ONLY): 25 min  Charges:    $Gait Training: 8-22 mins $Therapeutic Activity: 8-22 mins PT General Charges $$ ACUTE PT VISIT: 1 Visit                     Treylon Henard, PT  Acute Rehab Dept [facility]) [phone]  04/05/2023    [facility] 04/05/2023, 4:01 PM

## 2023-04-05 NOTE — TOC Progression Note (Signed)
Transition of Care [facility]) - Progression Note   Patient Details  Name: Paul Hopkins MRN: [id number] Date of Birth: Jan 30, 1934  [facility]) CM/SW Contact  Ewing Schlein, LCSW Phone Number: 04/05/2023, 1:02 PM  Clinical Narrative: CSW confirmed with [place] in admissions at [facility] that the facility will be able to admit the patient tomorrow. CSW updated patient's daughter, Robynn Pane, and hospitalist.  Expected Discharge Plan: Skilled Nursing Facility Barriers to Discharge: Continued Medical Work up  Expected Discharge Plan and Services In-house Referral: Clinical Social Work Living arrangements for the past 2 months: Independent Living Facility            DME Arranged: N/A DME Agency: NA  Social Determinants of Health (SDOH) Interventions SDOH Screenings   Food Insecurity: Patient Declined (04/01/2023)  Housing: High Risk (04/01/2023)  Transportation Needs: Patient Declined (04/01/2023)  Utilities: Patient Declined (04/01/2023)  Tobacco Use: Low Risk  (04/01/2023)   Readmission Risk Interventions     No data to display

## 2023-04-05 NOTE — Progress Notes (Signed)
PROGRESS NOTE    Paul Hopkins  QMV:[id number] DOB: 1934-01-02 DOA: 04/01/2023 PCP: Joycelyn Man, NP    Brief Narrative:  89 y.o. male with medical history significant of bradycardia, history of unspecified CVA, vascular dementia, delusional disorder, hypothyroidism, Mnire's disease, nonpressure chronic ulcer of buttock, osteoarthritis presented with hip pain after a fall.  Workup revealed L2-L3 compression fractures, new since 2021 but age indeterminate.  Right hip x-ray did not show any fracture or dislocation.  He was admitted for intractable pain.  PT recommending SNF placement.  Currently stable for discharge to SNF.   Subjective: Patient was seen and examined.  No overnight events.  Patient tells me it was quite difficult to get from bed to chair.  He will use Tylenol as needed.  Does not want to use any opiates. Assessment & Plan:   Close lumbar compression fracture after mechanical fall: Found to have a L2/L3 compression fracture with no height loss.  Neurosurgery recommended conservative management. Currently stable, back brace or corset only for pain relief. Adequate pain medications.  Weightbearing as tolerated.  Mobilize around with PT OT.  Refer to SNF for rehab.  Chronic medical issues including Vascular dementia, stable on Abilify. Hypothyroidism, stable on levothyroxine History of stroke, on aspirin.  No new neurological deficit.   DVT prophylaxis: enoxaparin (LOVENOX) injection 40 mg Start: 04/01/23 1400   Code Status: DNR Family Communication: None at the bedside Disposition Plan: Status is: Inpatient Remains inpatient appropriate because: Waiting for SNF bed     Consultants:  None, neurosurgery curbside by ER  Procedures:  None  Antimicrobials:  None     Objective: Vitals:   04/04/23 0450 04/04/23 1759 04/04/23 2256 04/05/23 0517  BP: (!) 154/64 135/77 (!) 158/71 126/65  Pulse: 64 68 62 60  Resp: 17 18 17 17   Temp: 98.5 F  (36.9 C) 98.2 F (36.8 C) 98.5 F (36.9 C) 97.9 F (36.6 C)  TempSrc: Oral  Oral Oral  SpO2: 96% 99% 97% 93%  Weight:      Height:        Intake/Output Summary (Last 24 hours) at 04/05/2023 1239 Last data filed at 04/05/2023 0900 Gross per 24 hour  Intake 480 ml  Output 2200 ml  Net -1720 ml   Filed Weights   04/01/23 0814  Weight: 68 kg    Examination:  General exam: Appears calm and comfortable  Respiratory system: Clear to auscultation. Respiratory effort normal. Cardiovascular system: S1 & S2 heard, RRR. No JVD, murmurs, rubs, gallops or clicks. No pedal edema. Gastrointestinal system: Abdomen is nondistended, soft and nontender. No organomegaly or masses felt. Normal bowel sounds heard. Central nervous system: Alert and oriented. No focal neurological deficits. Extremities: Symmetric 5 x 5 power. Skin: No rashes, lesions or ulcers Psychiatry: Judgement and insight appear normal. Mood & affect appropriate.     Data Reviewed: I have personally reviewed following labs and imaging studies  CBC: Recent Labs  Lab 04/01/23 1111  WBC 9.3  NEUTROABS 7.9*  HGB 13.8  HCT 41.3  MCV 92.4  PLT 183   Basic Metabolic Panel: Recent Labs  Lab 04/01/23 1111  NA 135  K 4.1  CL 103  CO2 26  GLUCOSE 117*  BUN 16  CREATININE 0.53*  CALCIUM 8.7*  MG 2.0  PHOS 3.0   GFR: Estimated Creatinine Clearance: 62.6 mL/min (A) (by C-G formula based on SCr of 0.53 mg/dL (L)). Liver Function Tests: Recent Labs  Lab 04/01/23 1111  AST 26  ALT 16  ALKPHOS 89  BILITOT 1.2  PROT 5.7*  ALBUMIN 3.6   No results for input(s): "LIPASE", "AMYLASE" in the last 168 hours. No results for input(s): "AMMONIA" in the last 168 hours. Coagulation Profile: No results for input(s): "INR", "PROTIME" in the last 168 hours. Cardiac Enzymes: No results for input(s): "CKTOTAL", "CKMB", "CKMBINDEX", "TROPONINI" in the last 168 hours. BNP (last 3 results) No results for input(s): "PROBNP"  in the last 8760 hours. HbA1C: No results for input(s): "HGBA1C" in the last 72 hours. CBG: No results for input(s): "GLUCAP" in the last 168 hours. Lipid Profile: No results for input(s): "CHOL", "HDL", "LDLCALC", "TRIG", "CHOLHDL", "LDLDIRECT" in the last 72 hours. Thyroid Function Tests: No results for input(s): "TSH", "T4TOTAL", "FREET4", "T3FREE", "THYROIDAB" in the last 72 hours. Anemia Panel: No results for input(s): "VITAMINB12", "FOLATE", "FERRITIN", "TIBC", "IRON", "RETICCTPCT" in the last 72 hours. Sepsis Labs: No results for input(s): "PROCALCITON", "LATICACIDVEN" in the last 168 hours.  No results found for this or any previous visit (from the past 240 hour(s)).       Radiology Studies: No results found.      Scheduled Meds:  acidophilus  1 capsule Oral BID   ARIPiprazole  10 mg Oral Daily   aspirin EC  81 mg Oral Daily   cholecalciferol  2,000 Units Oral Daily   enoxaparin (LOVENOX) injection  40 mg Subcutaneous Q24H   levothyroxine  88 mcg Oral QAC breakfast   multivitamin  1 tablet Oral Daily   multivitamin with minerals   Oral Daily   senna-docusate  1 tablet Oral BID   Continuous Infusions:   LOS: 2 days    Time spent: 35 minutes    Dorcas Carrow, MD [facility]

## 2023-04-06 DIAGNOSIS — S32000A Wedge compression fracture of unspecified lumbar vertebra, initial encounter for closed fracture: Secondary | ICD-10-CM | POA: Diagnosis not present

## 2023-04-06 NOTE — Plan of Care (Signed)
  Problem: Education: Goal: Knowledge of General Education information will improve Description: Including pain rating scale, medication(s)/side effects and non-pharmacologic comfort measures Outcome: Progressing   Problem: Activity: Goal: Risk for activity intolerance will decrease Outcome: Progressing   Problem: Pain Managment: Goal: General experience of comfort will improve Outcome: Progressing   

## 2023-04-06 NOTE — Plan of Care (Signed)
  Problem: Coping: Goal: Level of anxiety will decrease Outcome: Progressing   Problem: Pain Managment: Goal: General experience of comfort will improve Outcome: Progressing   

## 2023-04-06 NOTE — Care Management Important Message (Signed)
Important Message  Patient Details IM Letter given. Name: Paul Hopkins MRN: [id number] Date of Birth: 1933-10-14   Important Message Given:  Yes - Medicare IM     Caren Macadam 04/06/2023, 10:21 AM

## 2023-04-06 NOTE — TOC Transition Note (Signed)
Transition of Care [facility]) - CM/SW Discharge Note  Patient Details  Name: Paul Hopkins MRN: [id number] Date of Birth: 1933-09-22  Transition of [facility]) CM/SW Contact:  Ewing Schlein, LCSW Phone Number: 04/06/2023, 11:05 AM  Clinical Narrative: Patient will discharge to [facility] today. Patient will go to room 608 and the number for report is [phone]. Discharge summary, discharge orders, and SNF transfer report faxed to facility in hub. Medical necessity form done; PTAR scheduled. CSW notified daughter regarding transportation. RN updated. TOC signing off.  Final next level of care: Skilled Nursing Facility Barriers to Discharge: Barriers Resolved  Patient Goals and CMS Choice CMS Medicare.gov Compare Post Acute Care list provided to:: Patient Represenative (must comment) Choice offered to / list presented to : Adult Children  Discharge Placement PASRR number recieved: 04/03/23         Patient chooses bed at: [facility] Patient to be transferred to facility by: PTAR Name of family member notified: [name] (daughter) Patient and family notified of of transfer: 04/06/23  Discharge Plan and Services Additional resources added to the After Visit Summary for   In-house Referral: Clinical Social Work      DME Arranged: N/A DME Agency: NA  Social Determinants of Health (SDOH) Interventions SDOH Screenings   Food Insecurity: Patient Declined (04/01/2023)  Housing: High Risk (04/01/2023)  Transportation Needs: Patient Declined (04/01/2023)  Utilities: Patient Declined (04/01/2023)  Tobacco Use: Low Risk  (04/01/2023)   Readmission Risk Interventions     No data to display

## 2023-04-06 NOTE — Discharge Summary (Signed)
Physician Discharge Summary  Paul Hopkins [id number] DOB: Feb 22, 1934 DOA: 04/01/2023  PCP: Joycelyn Man, NP  Admit date: 04/01/2023 Discharge date: 04/06/2023  Admitted From: independent living  Disposition:  SNF  Recommendations for Outpatient Follow-up:  Follow up with PCP in 1-2 weeks    Discharge Condition:Fair   CODE STATUS: DNR Diet recommendation: Regular diet, nutritional supplements  Discharge summary: 89 y.o. male with medical history significant of bradycardia, history of unspecified CVA, vascular dementia, delusional disorder, hypothyroidism, Mnire's disease, nonpressure chronic ulcer of buttock, osteoarthritis presented with hip pain after a fall.  Workup revealed L2-L3 compression fractures, new since 2021 but age indeterminate.  Right hip x-ray did not show any fracture or dislocation.  He was admitted for intractable pain.  PT recommending SNF placement.  Currently stable for discharge to SNF.    Assessment & Plan:   Close lumbar compression fracture after mechanical fall: Found to have a L2/L3 compression fracture with no height loss.  Neurosurgery recommended conservative management. Currently stable, back brace or corset only for pain relief. Adequate pain medications.  Weightbearing as tolerated.  Mobilize around with PT OT.  Refer to SNF for rehab.   Chronic medical issues including Vascular dementia, stable on Abilify. Hypothyroidism, stable on levothyroxine History of stroke, on aspirin.  No new neurological deficit.  Patient is medically stable to transfer to skilled level of care.   Discharge Diagnoses:  Principal Problem:   Lumbar compression fracture, closed, initial encounter [facility]) Active Problems:   Vascular dementia (HCC)   Hypothyroidism (acquired)   Hypoproteinemia (HCC)   Lumbar stress fracture    Discharge Instructions  Discharge Instructions     Diet general   Complete by: As directed    Increase activity  slowly   Complete by: As directed       Allergies as of 04/06/2023       Reactions   Amoxicillin Other (See Comments)   Reaction??   Banana Other (See Comments)   Hip pain   Bean Pod Extract Diarrhea, Other (See Comments)   Explosive diarrhea   Blackberry [rubus Fruticosus] Other (See Comments)   Flares the patient's Mnire's disease   Broccoli [brassica Oleracea]    Carrot Oil Diarrhea, Other (See Comments)   Explosive diarrhea   Ciprofloxacin Other (See Comments)   Reaction??   Citric Acid Diarrhea, Other (See Comments)   Explosive diarrhea   Citrus Diarrhea, Other (See Comments)   Explosive diarrhea   Corn-containing Products Other (See Comments)   Explosive diarrhea   Egg [egg-derived Products] Diarrhea, Other (See Comments)   Explosive diarrhea   Milk-related Compounds Diarrhea, Other (See Comments)   Explosive diarrhea   Other Diarrhea, Other (See Comments)   NO BERRIES WITH TINY SEEDS: Flares the patient's Mnire's disease  Bread- Explosive diarrhea   Peanut-containing Drug Products Diarrhea, Other (See Comments)   NO LEGUMES!! NO PEAS!! Explosive diarrhea   Raspberry Other (See Comments)   Flares the patient's Mnire's disease   Salmon [fish Oil]    Salmon-oats-squash [alitraq]    Strawberry Extract Other (See Comments)   Flares the patient's Mnire's disease   Sulfa Antibiotics Other (See Comments)   Reaction??   Trimethoprim Other (See Comments)   Reaction??   Whey Diarrhea, Other (See Comments)   Explosive diarrhea        Medication List     TAKE these medications    acetaminophen 325 MG tablet Commonly known as: TYLENOL Take 2 tablets (650 mg total) by mouth  every 6 (six) hours as needed for mild pain (pain score 1-3) or moderate pain (pain score 4-6) (or Fever >/= 101).   ARIPiprazole 10 MG tablet Commonly known as: ABILIFY Take 10 mg by mouth daily.   aspirin EC 81 MG tablet Take 81 mg by mouth daily. Swallow whole. 4pm    levothyroxine 88 MCG tablet Commonly known as: SYNTHROID Take 88 mcg by mouth daily before breakfast.   MULTI-VITAMIN PO Take 1 tablet by mouth daily. 4 pm. Centrum silver men 50+   PRESERVISION AREDS 2 PO Take 1 capsule by mouth in the morning and at bedtime.   PROBIOTIC BLEND PO Take 1 capsule by mouth in the morning and at bedtime.   senna-docusate 8.6-50 MG tablet Commonly known as: Senokot-S Take 1 tablet by mouth 2 (two) times daily.   VITAMIN C PO Take by mouth.   VITAMIN D PO Take 2,000 Units by mouth daily. 4 pm        Contact information for after-discharge care     Destination     [facility]erred [facility] .   Service: Skilled Nursing Contact information: [address] Test Update Address [place] [address] [phone]                    Allergies  Allergen Reactions   Amoxicillin Other (See Comments)    Reaction??   Banana Other (See Comments)    Hip pain   Bean Pod Extract Diarrhea and Other (See Comments)    Explosive diarrhea   Blackberry [Rubus Fruticosus] Other (See Comments)    Flares the patient's Mnire's disease   Broccoli [Brassica Oleracea]    Carrot Oil Diarrhea and Other (See Comments)    Explosive diarrhea   Ciprofloxacin Other (See Comments)    Reaction??   Citric Acid Diarrhea and Other (See Comments)    Explosive diarrhea   Citrus Diarrhea and Other (See Comments)    Explosive diarrhea   Corn-Containing Products Other (See Comments)    Explosive diarrhea   Egg [Egg-Derived Products] Diarrhea and Other (See Comments)    Explosive diarrhea   Milk-Related Compounds Diarrhea and Other (See Comments)    Explosive diarrhea   Other Diarrhea and Other (See Comments)    NO BERRIES WITH TINY SEEDS: Flares the patient's Mnire's disease  Bread- Explosive diarrhea   Peanut-Containing Drug Products Diarrhea and Other (See Comments)    NO LEGUMES!! NO PEAS!! Explosive diarrhea   Raspberry Other  (See Comments)    Flares the patient's Mnire's disease   Salmon [Fish Oil]    Salmon-Oats-Squash [Alitraq]    Strawberry Extract Other (See Comments)    Flares the patient's Mnire's disease   Sulfa Antibiotics Other (See Comments)    Reaction??   Trimethoprim Other (See Comments)    Reaction??   Whey Diarrhea and Other (See Comments)    Explosive diarrhea    Consultations: Neurosurgery, phone consultation by ER   Procedures/Studies: DG Lumbar Spine Complete  Result Date: 04/01/2023 CLINICAL DATA:  Fall. EXAM: LUMBAR SPINE - COMPLETE 4+ VIEW COMPARISON:  11/27/2019 FINDINGS: L2 and L3 compression fractures without detected acute feature. Height loss is up to moderate at L3. There is lumbar spine degeneration especially affecting facets at L3-4 and below with mild anterolisthesis at L4-5. Subjective osteopenia. No aggressive bone lesion. IMPRESSION: 1. L2 and L3 compression fractures, new since 2021 abdominal CT but age indeterminate. 2. Lumbar spine degeneration especially affecting lower facets with L4-5 anterolisthesis. Electronically  Signed   By: Tiburcio Pea M.D.   On: 04/01/2023 09:16   DG Hip Unilat W or Wo Pelvis 2-3 Views Right  Result Date: 04/01/2023 CLINICAL DATA:  Fall. EXAM: DG HIP (WITH OR WITHOUT PELVIS) 3V RIGHT COMPARISON:  None Available. FINDINGS: There is no evidence of hip fracture or dislocation. There is no evidence of arthropathy or other focal bone abnormality. Subjective osteopenia. IMPRESSION: Negative for fracture or dislocation. Electronically Signed   By: Tiburcio Pea M.D.   On: 04/01/2023 09:00   (Echo, Carotid, EGD, Colonoscopy, ERCP)    Subjective: Patient seen and examined.  No overnight events.  Does not want to use any opiates.  He tells me he has no pain at rest.  He tells me it was hard to get out of the bed but he will continue to try.   Discharge Exam: Vitals:   04/05/23 1959 04/06/23 0559  BP: (!) 140/72 (!) 141/70  Pulse: 65 63   Resp: 18 16  Temp: 97.7 F (36.5 C) 98.8 F (37.1 C)  SpO2: 98% 94%   Vitals:   04/05/23 1329 04/05/23 1959 04/06/23 0500 04/06/23 0559  BP: 128/69 (!) 140/72  (!) 141/70  Pulse: 63 65  63  Resp: 17 18  16   Temp: 98 F (36.7 C) 97.7 F (36.5 C)  98.8 F (37.1 C)  TempSrc:  Oral  Oral  SpO2: 98% 98%  94%  Weight:   66.3 kg   Height:        General: Pt is alert, awake, not in acute distress Pleasant interaction. Cardiovascular: RRR, S1/S2 +, no rubs, no gallops Respiratory: CTA bilaterally, no wheezing, no rhonchi Abdominal: Soft, NT, ND, bowel sounds + Extremities: no edema, no cyanosis    The results of significant diagnostics from this hospitalization (including imaging, microbiology, ancillary and laboratory) are listed below for reference.     Microbiology: No results found for this or any previous visit (from the past 240 hour(s)).   Labs: BNP (last 3 results) No results for input(s): "BNP" in the last 8760 hours. Basic Metabolic Panel: Recent Labs  Lab 04/01/23 1111  NA 135  K 4.1  CL 103  CO2 26  GLUCOSE 117*  BUN 16  CREATININE 0.53*  CALCIUM 8.7*  MG 2.0  PHOS 3.0   Liver Function Tests: Recent Labs  Lab 04/01/23 1111  AST 26  ALT 16  ALKPHOS 89  BILITOT 1.2  PROT 5.7*  ALBUMIN 3.6   No results for input(s): "LIPASE", "AMYLASE" in the last 168 hours. No results for input(s): "AMMONIA" in the last 168 hours. CBC: Recent Labs  Lab 04/01/23 1111  WBC 9.3  NEUTROABS 7.9*  HGB 13.8  HCT 41.3  MCV 92.4  PLT 183   Cardiac Enzymes: No results for input(s): "CKTOTAL", "CKMB", "CKMBINDEX", "TROPONINI" in the last 168 hours. BNP: Invalid input(s): "POCBNP" CBG: No results for input(s): "GLUCAP" in the last 168 hours. D-Dimer No results for input(s): "DDIMER" in the last 72 hours. Hgb A1c No results for input(s): "HGBA1C" in the last 72 hours. Lipid Profile No results for input(s): "CHOL", "HDL", "LDLCALC", "TRIG", "CHOLHDL",  "LDLDIRECT" in the last 72 hours. Thyroid function studies No results for input(s): "TSH", "T4TOTAL", "T3FREE", "THYROIDAB" in the last 72 hours.  Invalid input(s): "FREET3" Anemia work up No results for input(s): "VITAMINB12", "FOLATE", "FERRITIN", "TIBC", "IRON", "RETICCTPCT" in the last 72 hours. Urinalysis    Component Value Date/Time   COLORURINE YELLOW 10/27/2021 2026   APPEARANCEUR  CLEAR 10/27/2021 2026   LABSPEC 1.010 10/27/2021 2026   PHURINE 7.0 10/27/2021 2026   GLUCOSEU NEGATIVE 10/27/2021 2026   HGBUR NEGATIVE 10/27/2021 2026   BILIRUBINUR NEGATIVE 10/27/2021 2026   KETONESUR NEGATIVE 10/27/2021 2026   PROTEINUR NEGATIVE 10/27/2021 2026   NITRITE NEGATIVE 10/27/2021 2026   LEUKOCYTESUR NEGATIVE 10/27/2021 2026   Sepsis Labs Recent Labs  Lab 04/01/23 1111  WBC 9.3   Microbiology No results found for this or any previous visit (from the past 240 hour(s)).   Time coordinating discharge: 28 minutes  SIGNED:   Dorcas Carrow, MD  [facility] 04/06/2023, 9:26 AM

## 2023-05-04 ENCOUNTER — Encounter: Payer: Self-pay | Admitting: Gastroenterology

## 2023-05-04 ENCOUNTER — Ambulatory Visit
Admission: RE | Admit: 2023-05-04 | Discharge: 2023-05-04 | Disposition: A | Discharge status: Home / Self Care | Payer: Medicare Other | Source: Ambulatory Visit | Attending: Gastroenterology | Admitting: Gastroenterology

## 2023-05-04 ENCOUNTER — Other Ambulatory Visit: Payer: Self-pay | Admitting: Gastroenterology

## 2023-05-04 ENCOUNTER — Ambulatory Visit (INDEPENDENT_AMBULATORY_CARE_PROVIDER_SITE_OTHER): Payer: Medicare Other | Admitting: Gastroenterology

## 2023-05-04 VITALS — BP 134/70 | HR 68

## 2023-05-04 DIAGNOSIS — K580 Irritable bowel syndrome with diarrhea: Secondary | ICD-10-CM

## 2023-05-04 DIAGNOSIS — R159 Full incontinence of feces: Secondary | ICD-10-CM | POA: Diagnosis not present

## 2023-05-04 NOTE — Progress Notes (Signed)
Chief Complaint: FU  Referring Provider:  Eloisa Northern, MD      ASSESSMENT AND PLAN;   #1. Fecal incontinence  #2. IBS-D  #3.  Advanced dementia.  Plan: -Stool for GI pathogens -Stop laxatives/benfiber -X ray KUB 2V -Stop MVI -Kegel's exercises.   HPI:    Paul Hopkins is a 89 y.o. male  Accompanied by his daughter Previous patient of Dr. Orvan Falconer With previously diagnosed IBS-D, vascular dementia with delusional disorder, hypothyroidism, CVA in 2017, history of Mnire's disease with a hearing deficit.  He was diagnosed with IBS in the past.  He has multiple food allergies and intolerances as below.  He lives at [facility] in independent living.   Discussed the use of AI scribe software for clinical note transcription with the patient, who gave verbal consent to proceed.  History of Present Illness   The patient, a resident of an independent living facility with a history of dementia and food intolerances, has been experiencing a significant change in bowel habits over the past month. Following a six-day hospital stay and a three-week rehabilitation period, the patient has been experiencing frequent urges to have bowel movements, often resulting in soiling of his briefs. The quantity of stool is reportedly small, and sometimes non-existent. Despite this, the patient has been going through briefs and wet wipes at an accelerated rate, indicating a possible issue with bowel control rather than an increase in stool production.  Upon returning to his apartment a week ago, the patient's bowel issues have persisted. The patient's caregiver reported that the patient had to go to the bathroom five times within an hour during an occupational therapy session. The following morning, the patient had a single, larger bowel movement that was soft in consistency. The patient has not reported any abdominal pain or blood in the stool.  The patient's diet, as reported by the caregiver, is  carefully managed due to numerous food intolerances. The patient's food intake has reportedly decreased since leaving rehab. The patient's weight has remained relatively stable, with a slight decrease noted. The patient is also physically active, able to walk approximately 600 feet with the aid of a walker.  The patient was previously given laxatives and Senna during his hospital stay, which the caregiver was unaware of. The patient has not been taking these medications since returning to his apartment. The patient was also given a dose of Benefiber, which seemed to result in a single, larger bowel movement rather than multiple smaller ones. However, the patient's bowel habits have not returned to his normal pattern of two large bowel movements per day, typically after meals.  The patient's blood tests, including hemoglobin, MCV, and creatinine levels, are reportedly normal. The patient's magnesium level is also within the normal range. The patient has been taking a multivitamin, which contains magnesium, but this was not causing issues prior to the hospital stay. The patient has been advised to stop taking the multivitamin temporarily.  The patient has a history of a broken back and is currently wearing a back brace. The patient also has dementia, but this does not appear to be contributing to the current bowel issues. The patient's caregiver is concerned about a possible infection or other underlying cause for the change in bowel habits.     Past GI workup: -Colonoscopy in [place] >10 yrs ago- neg. No need for repeat per daughter -CT Abdo/pelvis without contrast 11/2019: neg for any acute abnormalities.  Marked prostate enlargement.  Wt Readings from Last  3 Encounters:  04/06/23 146 lb 2.6 oz (66.3 kg)  10/27/21 150 lb (68 kg)  05/19/21 148 lb 3.2 oz (67.2 kg)    Past Medical History:  Diagnosis Date   Bradycardia    Delusional disorder (HCC)    Dementia (HCC)    Hypothyroidism     Hypothyroidism    Meniere disease    Non-pressure chronic ulcer of buttock (HCC)    Osteoarthritis    Stroke (HCC)    Vascular dementia (HCC)     Past Surgical History:  Procedure Laterality Date   EXCISIONAL HEMORRHOIDECTOMY      Family History  Problem Relation Age of Onset   Colon cancer Father     Social History   Tobacco Use   Smoking status: Never   Smokeless tobacco: Never  Vaping Use   Vaping status: Never Used  Substance Use Topics   Alcohol use: Never   Drug use: Never    Current Outpatient Medications  Medication Sig Dispense Refill   acetaminophen (TYLENOL) 325 MG tablet Take 2 tablets (650 mg total) by mouth every 6 (six) hours as needed for mild pain (pain score 1-3) or moderate pain (pain score 4-6) (or Fever >/= 101).     ARIPiprazole (ABILIFY) 10 MG tablet Take 10 mg by mouth daily.     Ascorbic Acid (VITAMIN C PO) Take by mouth.     aspirin EC 81 MG tablet Take 81 mg by mouth daily. Swallow whole. 4pm     levothyroxine (SYNTHROID) 88 MCG tablet Take 88 mcg by mouth daily before breakfast.     Multiple Vitamin (MULTI-VITAMIN PO) Take 1 tablet by mouth daily. 4 pm. Centrum silver men 50+     Multiple Vitamins-Minerals (PRESERVISION AREDS 2 PO) Take 1 capsule by mouth in the morning and at bedtime.     Probiotic Product (PROBIOTIC BLEND PO) Take 1 capsule by mouth in the morning and at bedtime.     VITAMIN D PO Take 2,000 Units by mouth daily. 4 pm     senna-docusate (SENOKOT-S) 8.6-50 MG tablet Take 1 tablet by mouth 2 (two) times daily. (Patient not taking: Reported on 05/04/2023)     No current facility-administered medications for this visit.    Allergies  Allergen Reactions   Amoxicillin Other (See Comments)    Reaction??   Banana Other (See Comments)    Hip pain   Bean Pod Extract Diarrhea and Other (See Comments)    Explosive diarrhea   Blackberry [Rubus Fruticosus] Other (See Comments)    Flares the patient's Mnire's disease   Broccoli  [Brassica Oleracea]    Carrot Oil Diarrhea and Other (See Comments)    Explosive diarrhea   Ciprofloxacin Other (See Comments)    Reaction??   Citric Acid Diarrhea and Other (See Comments)    Explosive diarrhea   Citrus Diarrhea and Other (See Comments)    Explosive diarrhea   Corn-Containing Products Other (See Comments)    Explosive diarrhea   Egg [Egg-Derived Products] Diarrhea and Other (See Comments)    Explosive diarrhea   Milk-Related Compounds Diarrhea and Other (See Comments)    Explosive diarrhea   Other Diarrhea and Other (See Comments)    NO BERRIES WITH TINY SEEDS: Flares the patient's Mnire's disease  Bread- Explosive diarrhea   Peanut-Containing Drug Products Diarrhea and Other (See Comments)    NO LEGUMES!! NO PEAS!! Explosive diarrhea   Raspberry Other (See Comments)    Flares the patient's Mnire's disease  Salmon [Fish Oil]    Salmon-Oats-Squash [Alitraq]    Strawberry Extract Other (See Comments)    Flares the patient's Mnire's disease   Sulfa Antibiotics Other (See Comments)    Reaction??   Trimethoprim Other (See Comments)    Reaction??   Whey Diarrhea and Other (See Comments)    Explosive diarrhea    Review of Systems:  neg     Physical Exam:    There were no vitals taken for this visit. Wt Readings from Last 3 Encounters:  04/06/23 146 lb 2.6 oz (66.3 kg)  10/27/21 150 lb (68 kg)  05/19/21 148 lb 3.2 oz (67.2 kg)   Constitutional:  on wheelchair in no acute distress. Psychiatric: Normal mood and affect. Behavior is normal.  Abdominal: Soft, nondistended. Nontender. Bowel sounds active throughout. There are no masses palpable. No hepatomegaly. Rectal: Deferred Neurological: Has dementia Skin: Skin is warm and dry. No rashes noted.  Data Reviewed: I have personally reviewed following labs and imaging studies  CBC:    Latest Ref Rng & Units 04/01/2023   11:11 AM 10/27/2021   10:32 PM 03/19/2021   10:54 PM  CBC  WBC 4.0 - 10.5  K/uL 9.3  7.2  6.1   Hemoglobin 13.0 - 17.0 g/dL 47.4  25.9  56.3   Hematocrit 39.0 - 52.0 % 41.3  42.0  43.9   Platelets 150 - 400 K/uL 183  189  229     CMP:    Latest Ref Rng & Units 04/01/2023   11:11 AM 10/27/2021   10:32 PM 03/19/2021   10:54 PM  CMP  Glucose 70 - 99 mg/dL 875  643  329   BUN 8 - 23 mg/dL 16  14  18    Creatinine 0.61 - 1.24 mg/dL 5.18  8.41  6.60   Sodium 135 - 145 mmol/L 135  138  138   Potassium 3.5 - 5.1 mmol/L 4.1  4.3  4.3   Chloride 98 - 111 mmol/L 103  103  104   CO2 22 - 32 mmol/L 26  26  26    Calcium 8.9 - 10.3 mg/dL 8.7  9.0  9.5   Total Protein 6.5 - 8.1 g/dL 5.7   6.3   Total Bilirubin 0.3 - 1.2 mg/dL 1.2   0.9   Alkaline Phos 38 - 126 U/L 89   73   AST 15 - 41 U/L 26   23   ALT 0 - 44 U/L 16   12        Edman Circle, MD 05/04/2023, 11:37 AM  Cc: Eloisa Northern, MD

## 2023-05-04 NOTE — Patient Instructions (Addendum)
_______________________________________________________  If your blood pressure at your visit was 140/90 or greater, please contact your primary care physician to follow up on this.  _______________________________________________________  If you are age 87 or older, your body mass index should be between 23-30. Your There is no height or weight on file to calculate BMI. If this is out of the aforementioned range listed, please consider follow up with your Primary Care Provider.  If you are age 46 or younger, your body mass index should be between 19-25. Your There is no height or weight on file to calculate BMI. If this is out of the aformentioned range listed, please consider follow up with your Primary Care Provider.   ________________________________________________________  The [facility] providers would like to encourage you to use [facility] to communicate with providers for non-urgent requests or questions.  Due to long hold times on the telephone, sending your provider a message by [facility] may be a faster and more efficient way to get a response.  Please allow 48 business hours for a response.  Please remember that this is for non-urgent requests.  _______________________________________________________  Your provider has requested that you have an abdominal x ray before leaving today. Please go to the basement floor to our Radiology department for the test.  Stop multivitamins laxative and benefiber  Your provider has ordered "[employer]" stool testing for you. You have received a kit from our office today containing all necessary supplies to complete this test. Please carefully read the stool collection instructions provided in the kit before opening the accompanying materials. In addition, be sure to place the label from the top right corner of the laboratory request sheet onto the "puritan opti-swab" tube that is supplied in the kit. This label should include your full name and date of  birth. After completing the test, you should secure the purtian tube into the specimen biohazard bag. The laboratory request information sheet (including date and time of specimen collection) should be placed into the outside pocket of the specimen biohazard bag and returned to the [place] lab with 2 days of collection.   If the laboratory information sheet specimen date and time are not filled out, the test will NOT be performed.  Kegel Exercises  Kegel exercises can help strengthen your pelvic floor muscles. The pelvic floor is a group of muscles that support your rectum, small intestine, and bladder. In females, pelvic floor muscles also help support the uterus. These muscles help you control the flow of urine and stool (feces). Kegel exercises are painless and simple. They do not require any equipment. Your provider may suggest Kegel exercises to: Improve bladder and bowel control. Improve sexual response. Improve weak pelvic floor muscles after surgery to remove the uterus (hysterectomy) or after pregnancy, in females. Improve weak pelvic floor muscles after prostate gland removal or surgery, in males. Kegel exercises involve squeezing your pelvic floor muscles. These are the same muscles you squeeze when you try to stop the flow of urine or keep from passing gas. The exercises can be done while sitting, standing, or lying down, but it is best to vary your position. Ask your health care provider which exercises are safe for you. Do exercises exactly as told by your health care provider and adjust them as directed. Do not begin these exercises until told by your health care provider. Exercises How to do Kegel exercises: Squeeze your pelvic floor muscles tight. You should feel a tight lift in your rectal area. If you are a  male, you should also feel a tightness in your vaginal area. Keep your stomach, buttocks, and legs relaxed. Hold the muscles tight for up to 10 seconds. Breathe  normally. Relax your muscles for up to 10 seconds. Repeat as told by your health care provider. Repeat this exercise daily as told by your health care provider. Continue to do this exercise for at least 4-6 weeks, or for as long as told by your health care provider. You may be referred to a physical therapist who can help you learn more about how to do Kegel exercises. Depending on your condition, your health care provider may recommend: Varying how long you squeeze your muscles. Doing several sets of exercises every day. Doing exercises for several weeks. Making Kegel exercises a part of your regular exercise routine. This information is not intended to replace advice given to you by your health care provider. Make sure you discuss any questions you have with your health care provider. Document Revised: 10/08/2020 Document Reviewed: 10/08/2020 Elsevier Patient Education  2024 Elsevier Inc.  Thank you,  Dr. Lynann Bologna

## 2023-05-05 ENCOUNTER — Telehealth: Payer: Self-pay | Admitting: Gastroenterology

## 2023-05-05 NOTE — Telephone Encounter (Signed)
Daughter requesting to speak with a nurse in regards to recommendation for patient . Please advise.

## 2023-05-05 NOTE — Telephone Encounter (Signed)
Spoke with patient's daughter regarding MD recommendations. Discussed ED precautions & advised her that she can always call in after hours/over the weekend to discuss with our on call provider. Pt's daughter verbalized all understanding.

## 2023-05-05 NOTE — Telephone Encounter (Signed)
X-ray KUB results are still pending.  I do see some stool in the left colon.  I do not see any in the right colon.  Unfortunately, it is a difficult situation due to dementia, decreased mobility. Previous creatinine was normal.  I doubt if he would get dehydrated  Plan: -Please give Imodium A-D BID PRN for diarrhea -Will wait for stool studies -Will wait for official x-ray KUB report.  This might take some time as well. -At this point, we have to treat him symptomatically.  RG

## 2023-05-05 NOTE — Telephone Encounter (Signed)
Patient's daughter called in to follow up from yesterday's OV. States her dad has already had two episodes of diarrhea this morning & she's concerned he will end up dehydrated and back in the hospital. He has stopped MV. Pt has diarrhea when he drinks electrolyte drinks (gatorade & pedialyte). States Dr. Chales Abrahams recommended calcium supplement, but it contains ingredients that cause diarrhea. Discussed trying imodium OTC, but patient does not want to take this d/t constipation. Daughter didn't realize that the stool test could take up to a week for results. XR results pending. She'd like further recommendations for her dad in the meantime.

## 2023-05-17 ENCOUNTER — Telehealth: Payer: Self-pay | Admitting: Gastroenterology

## 2023-05-17 NOTE — Telephone Encounter (Signed)
Patient daughter is returning your call regarding her father results. Patient is requesting a call back. Please advise.

## 2023-05-17 NOTE — Telephone Encounter (Signed)
GI panel negative and daughter made aware  Daughter asking what can be done about the diarrhea since its not stopping. She said that patient doesn't like taking medications and only been doing the imodium maybe one yesterday and the day before and that they haven't started the Kegel exercises yet and she thinks he stopped the benefiber now but did stop the laxative. I told her I could sent a message to Dr Chales Abrahams to see if there are any alternative non medication remedies but she didn't like that and she asked about the Xray and I told her that hasn't come back Please help and advise

## 2023-05-17 NOTE — Telephone Encounter (Signed)
Spoke with Pt daughter. Pt daughter made aware of Dr. Chales Abrahams recommendations: Pt daughter verbalized understanding with all questions answered.

## 2023-05-17 NOTE — Telephone Encounter (Signed)
Left message for pt daughter to call back  ?

## 2023-05-17 NOTE — Telephone Encounter (Signed)
Patients daughter called to get results.

## 2023-05-22 ENCOUNTER — Telehealth: Payer: Self-pay | Admitting: Gastroenterology

## 2023-05-22 NOTE — Telephone Encounter (Signed)
Pt daughter made aware of recent results from xray report. Pt daughter stated that the pt is still having diarrhea and questioning next steps:  Please review and advise.

## 2023-05-22 NOTE — Telephone Encounter (Signed)
Patient daughter called and stated that she would like to go over her father X-Ray results. Patient daughter is requesting a call back. Please advise.

## 2023-05-23 NOTE — Telephone Encounter (Signed)
Pt daughter made aware of Dr. Chales Abrahams recommendations: Pt daughter verbalized understanding with all questions answered.

## 2023-05-23 NOTE — Telephone Encounter (Signed)
If still with problems, I think pt may have overflow incontinence X-ray KUB does show moderate stool in the left colon without obstruction.  Lets -Do MiraLAX 17 g p.o. twice daily until a large bowel movement, then once a day.  -Let us know how he feels in a week -Expected to have significant diarrhea and more incontinence over a week. RG

## 2023-05-24 ENCOUNTER — Encounter: Payer: Self-pay | Admitting: Gastroenterology

## 2023-05-25 NOTE — Telephone Encounter (Signed)
Inbound call from patient's daughter stating patient does not want to continue taking miralax but would like to take benefiber instead. Daughter is requesting a call back to be advised. Please advise, thank you.

## 2023-05-25 NOTE — Telephone Encounter (Signed)
Spoke with daughter & she says her dad doesn't want to take Miralax BID. Educated her on the overflow diarrhea & the need for miralax until he has a large bowel movement, however he has dementia and she's not sure he will do it. Requesting if he can do benefiber. Asked if she could see if pt would at least do one dose of miralax in addition to the benefiber to see if that helps. She verbalized all understanding.

## 2023-06-16 ENCOUNTER — Ambulatory Visit (INDEPENDENT_AMBULATORY_CARE_PROVIDER_SITE_OTHER): Payer: Medicare Other | Admitting: Podiatry

## 2023-06-16 ENCOUNTER — Encounter: Payer: Self-pay | Admitting: Podiatry

## 2023-06-16 VITALS — Ht 72.0 in | Wt 146.0 lb

## 2023-06-16 DIAGNOSIS — M79675 Pain in left toe(s): Secondary | ICD-10-CM | POA: Diagnosis not present

## 2023-06-16 DIAGNOSIS — M79674 Pain in right toe(s): Secondary | ICD-10-CM

## 2023-06-16 DIAGNOSIS — B351 Tinea unguium: Secondary | ICD-10-CM

## 2023-06-16 NOTE — Progress Notes (Signed)
 This patient presents to the office with chief complaint of long thick painful nails.  Patient says the nails are painful walking and wearing shoes.  This patient is unable to self treat.  This patient is unable to trim his  nails since she is unable to reach his  nails.  he presents to the office for preventative foot care services.  General Appearance  Alert, conversant and in no acute stress.  Vascular  Dorsalis pedis and posterior tibial  pulses are palpable  bilaterally.  Capillary return is within normal limits  bilaterally. Temperature is within normal limits  bilaterally.  Neurologic  Senn-Weinstein monofilament wire test within normal limits  bilaterally. Muscle power within normal limits bilaterally.  Nails Thick disfigured discolored nails with subungual debris  from hallux to fifth toes bilaterally. No evidence of bacterial infection or drainage bilaterally. Proximal nail plate is minimal at base hallux nail right.  Orthopedic  No limitations of motion  feet .  No crepitus or effusions noted.  No bony pathology or digital deformities noted.  Skin  normotropic skin with no porokeratosis noted bilaterally.  No signs of infections or ulcers noted.     Onychomycosis  Nails  B/L.  Pain in right toes  Pain in left toes  Debridement of nails both feet followed trimming the nails with dremel tool.    RTC  10 weeks    Cordella Bold DPM

## 2023-07-10 ENCOUNTER — Other Ambulatory Visit: Payer: Self-pay | Admitting: Medical

## 2023-07-10 DIAGNOSIS — M25552 Pain in left hip: Secondary | ICD-10-CM

## 2023-07-11 ENCOUNTER — Ambulatory Visit
Admission: RE | Admit: 2023-07-11 | Discharge: 2023-07-11 | Disposition: A | Discharge status: Home / Self Care | Payer: Medicare Other | Source: Ambulatory Visit | Attending: Medical | Admitting: Medical

## 2023-07-11 DIAGNOSIS — M25552 Pain in left hip: Secondary | ICD-10-CM

## 2023-08-15 ENCOUNTER — Other Ambulatory Visit: Payer: Self-pay

## 2023-08-15 ENCOUNTER — Emergency Department (HOSPITAL_COMMUNITY)
Admission: EM | Admit: 2023-08-15 | Discharge: 2023-08-15 | Disposition: A | Discharge status: Home / Self Care | Attending: Emergency Medicine | Admitting: Emergency Medicine

## 2023-08-15 ENCOUNTER — Emergency Department (HOSPITAL_COMMUNITY)

## 2023-08-15 DIAGNOSIS — U071 COVID-19: Secondary | ICD-10-CM | POA: Insufficient documentation

## 2023-08-15 DIAGNOSIS — R059 Cough, unspecified: Secondary | ICD-10-CM | POA: Diagnosis present

## 2023-08-15 LAB — RESP PANEL BY RT-PCR (RSV, FLU A&B, COVID)  RVPGX2
Influenza A by PCR: NEGATIVE
Influenza B by PCR: NEGATIVE
Resp Syncytial Virus by PCR: NEGATIVE
SARS Coronavirus 2 by RT PCR: POSITIVE — AB

## 2023-08-15 MED ORDER — PAXLOVID (300/100) 20 X 150 MG & 10 X 100MG PO TBPK: 3.0000 | ORAL_TABLET | Freq: Two times a day (BID) | ORAL | 0 refills | Status: DC

## 2023-08-15 MED ORDER — ONDANSETRON HCL 4 MG PO TABS: 4.0000 mg | ORAL_TABLET | Freq: Four times a day (QID) | ORAL | 0 refills | Status: DC | PRN

## 2023-08-15 MED ORDER — BENZONATATE 100 MG PO CAPS: 100.0000 mg | ORAL_CAPSULE | Freq: Three times a day (TID) | ORAL | 0 refills | Status: DC

## 2023-08-15 NOTE — ED Notes (Signed)
 Family member requested this RN warm up oatmeal for pt. Instant oatmeal warmed up for pt, also provided with water. Pt finished all of the oatmeal, and family requested this RN warm up instant rice for pt. Request complete. Pt tolerating PO.

## 2023-08-15 NOTE — ED Notes (Signed)
 Report attempt X1 for [place].

## 2023-08-15 NOTE — ED Notes (Signed)
 Report called to [place] at this time.

## 2023-08-15 NOTE — ED Provider Notes (Signed)
 [place] EMERGENCY DEPARTMENT AT [facility] Provider Note   CSN: [id number] Arrival date & time: 08/15/23  1324     History Chief Complaint  Patient presents with   Fever    HPI Paul Hopkins is a 89 y.o. male presenting for fever cough congestion.  He is an 89 year old male with an expansive medical history.  He comes from a facility.  Daughter is at bedside providing most of the history.  She states that he has been doing overall very well is having no respiratory or gastrointestinal symptoms but had a fever today of uncertain etiology. Multiple sick patients in the facility he lives at. Patient has no acute complaints besides feeling warm.  Patient's recorded medical, surgical, social, medication list and allergies were reviewed in the Snapshot window as part of the initial history.   Review of Systems   Review of Systems  Constitutional:  Positive for fever. Negative for chills.  HENT:  Negative for ear pain and sore throat.   Eyes:  Negative for pain and visual disturbance.  Respiratory:  Negative for cough and shortness of breath.   Cardiovascular:  Negative for chest pain and palpitations.  Gastrointestinal:  Negative for abdominal pain and vomiting.  Genitourinary:  Negative for dysuria and hematuria.  Musculoskeletal:  Negative for arthralgias and back pain.  Skin:  Negative for color change and rash.  Neurological:  Negative for seizures and syncope.  All other systems reviewed and are negative.   Physical Exam Updated Vital Signs BP 139/69 (BP Location: Left Arm)   Pulse 64   Temp 98.9 F (37.2 C) (Oral)   Resp 14   Ht 6' (1.829 m)   Wt 70 kg   SpO2 98%   BMI 20.93 kg/m  Physical Exam Vitals and nursing note reviewed.  Constitutional:      General: He is not in acute distress.    Appearance: He is well-developed.  HENT:     Head: Normocephalic and atraumatic.  Eyes:     Conjunctiva/sclera: Conjunctivae normal.  Cardiovascular:      Rate and Rhythm: Normal rate and regular rhythm.     Heart sounds: No murmur heard. Pulmonary:     Effort: Pulmonary effort is normal. No respiratory distress.     Breath sounds: Normal breath sounds.  Abdominal:     Palpations: Abdomen is soft.     Tenderness: There is no abdominal tenderness.  Musculoskeletal:        General: No swelling.     Cervical back: Neck supple.  Skin:    General: Skin is warm and dry.     Capillary Refill: Capillary refill takes less than 2 seconds.  Neurological:     Mental Status: He is alert.  Psychiatric:        Mood and Affect: Mood normal.      ED Course/ Medical Decision Making/ A&P    Procedures Procedures   Medications Ordered in ED Medications - No data to display Medical Decision Making:   Paul Hopkins is a 89 y.o. male who presented to the ED today with subjective fever, cough, congestion detailed above.    Patient placed on continuous vitals and telemetry monitoring while in ED which was reviewed periodically.   Complete initial physical exam performed, notably the patient  was hemodynamically stable in no acute distress.  Posterior oropharynx illuminated and without obvious swelling or deformity.  Patient is without neck stiffness.    Reviewed and confirmed nursing documentation  for past medical history, family history, social history.    Initial Assessment:   With the patient's presentation of fever cough congestion, most likely diagnosis is developing viral upper respiratory infection. Other diagnoses were considered including (but not limited to) peritonsillar abscess, retropharyngeal abscess, pneumonia. These are considered less likely due to history of present illness and physical exam findings.   This is most consistent with an acute life/limb threatening illness complicated by underlying chronic conditions. Considered meningitis, however patient's symptoms, vital signs, physical exam findings including lack of meningismus  seem grossly less consistent at this time. Initial Plan:  Viral screening including COVID/flu testing to evaluate for common viral etiologies that need to be tracked CXR to evaluate for structural/infectious intrathoracic pathology.  Empiric treatment with antipyretics including acetaminophen in ambulatory setting Objective evaluation as below reviewed   Initial Study Results:   Laboratory  All laboratory results reviewed without evidence of clinically relevant pathology.   Exceptions include: Positive COVID  Radiology:  All images reviewed independently. Agree with radiology report at this time.   DG Chest 2 View  Final Result         Final Assessment and Plan:   On reassessment, patient is ambulatory tolerating p.o. intake in no acute distress.   Patient's COVID test was positive and patient is a candidate for treatment with Paxlovid due to renal function being normal, duration of symptoms and lack of any contraindication  Family called out stating that the patient was able to tolerate p.o. intake and that they wanted him discharge as soon as possible with supportive care to prevent risk of hospital-acquired complications. Patient is currently stable for outpatient care and management with no indication for hospitalization or transfer at this time.  Discussed all findings with patient expressed understanding.  Disposition:  Based on the above findings, I believe patient is stable for discharge.    Patient/family educated about specific return precautions for given chief complaint and symptoms.  Patient/family educated about follow-up with PCP.     Patient/family expressed understanding of return precautions and need for follow-up. Patient spoken to regarding all imaging and laboratory results and appropriate follow up for these results. All education provided in verbal form with additional information in written form. Time was allowed for answering of patient questions. Patient  discharged.    Emergency Department Medication Summary:   Medications - No data to display         Clinical Impression:  1. COVID      Discharge   Clinical Impression:  1. COVID      Discharge   Final Clinical Impression(s) / ED Diagnoses Final diagnoses:  COVID    Rx / DC Orders ED Discharge Orders          Ordered    benzonatate (TESSALON) 100 MG capsule  Every 8 hours        08/15/23 1223    nirmatrelvir/ritonavir (PAXLOVID, 300/100,) 20 x 150 MG & 10 x 100MG  TBPK  2 times daily        08/15/23 1223    ondansetron (ZOFRAN) 4 MG tablet  Every 6 hours PRN        08/15/23 1223              Glyn Ade, MD 08/15/23 [phone]

## 2023-08-15 NOTE — ED Notes (Signed)
 Patient transported to X-ray

## 2023-08-15 NOTE — ED Triage Notes (Signed)
 Pt BIB[facility] from [facility] for fever and cough for one day. Pt temp at facility 103.2 given tylenol and now temp 101.5 with EMS. Pt reports generalized weakness and feeling unwell.   150/70 80 hr 97% RA

## 2023-08-18 ENCOUNTER — Emergency Department (HOSPITAL_COMMUNITY)
Admission: EM | Admit: 2023-08-18 | Discharge: 2023-08-18 | Disposition: A | Discharge status: Home / Self Care | Source: Home / Self Care | Attending: Emergency Medicine | Admitting: Emergency Medicine

## 2023-08-18 ENCOUNTER — Other Ambulatory Visit: Payer: Self-pay

## 2023-08-18 DIAGNOSIS — N3000 Acute cystitis without hematuria: Secondary | ICD-10-CM | POA: Diagnosis not present

## 2023-08-18 DIAGNOSIS — U071 COVID-19: Secondary | ICD-10-CM | POA: Insufficient documentation

## 2023-08-18 DIAGNOSIS — Z91010 Allergy to peanuts: Secondary | ICD-10-CM | POA: Insufficient documentation

## 2023-08-18 DIAGNOSIS — F0390 Unspecified dementia without behavioral disturbance: Secondary | ICD-10-CM | POA: Insufficient documentation

## 2023-08-18 DIAGNOSIS — R1084 Generalized abdominal pain: Secondary | ICD-10-CM | POA: Insufficient documentation

## 2023-08-18 DIAGNOSIS — Z7982 Long term (current) use of aspirin: Secondary | ICD-10-CM | POA: Insufficient documentation

## 2023-08-18 DIAGNOSIS — A4189 Other specified sepsis: Secondary | ICD-10-CM | POA: Diagnosis not present

## 2023-08-18 LAB — COMPREHENSIVE METABOLIC PANEL
ALT: 17 U/L (ref 0–44)
AST: 31 U/L (ref 15–41)
Albumin: 3 g/dL — ABNORMAL LOW (ref 3.5–5.0)
Alkaline Phosphatase: 87 U/L (ref 38–126)
Anion gap: 5 (ref 5–15)
BUN: 16 mg/dL (ref 8–23)
CO2: 28 mmol/L (ref 22–32)
Calcium: 8 mg/dL — ABNORMAL LOW (ref 8.9–10.3)
Chloride: 104 mmol/L (ref 98–111)
Creatinine, Ser: 0.77 mg/dL (ref 0.61–1.24)
GFR, Estimated: >60 mL/min (ref >60)
Glucose, Bld: 92 mg/dL (ref 70–99)
Potassium: 3.6 mmol/L (ref 3.5–5.1)
Sodium: 137 mmol/L (ref 135–145)
Total Bilirubin: 0.5 mg/dL (ref 0.0–1.2)
Total Protein: 5.2 g/dL — ABNORMAL LOW (ref 6.5–8.1)

## 2023-08-18 LAB — CBC
HCT: 39.6 % (ref 39.0–52.0)
Hemoglobin: 13.1 g/dL (ref 13.0–17.0)
MCH: 31 pg (ref 26.0–34.0)
MCHC: 33.1 g/dL (ref 30.0–36.0)
MCV: 93.8 fL (ref 80.0–100.0)
Platelets: 182 10*3/uL (ref 150–400)
RBC: 4.22 MIL/uL (ref 4.22–5.81)
RDW: 13.5 % (ref 11.5–15.5)
WBC: 3.8 10*3/uL — ABNORMAL LOW (ref 4.0–10.5)
nRBC: 0 % (ref 0.0–0.2)

## 2023-08-18 LAB — LIPASE, BLOOD: Lipase: 31 U/L (ref 11–51)

## 2023-08-18 MED ORDER — LACTATED RINGERS IV BOLUS
1000.0000 mL | Freq: Once | INTRAVENOUS | Status: AC
  Administered 2023-08-18: 1000 mL via INTRAVENOUS

## 2023-08-18 MED ORDER — LACTATED RINGERS IV BOLUS
500.0000 mL | Freq: Once | INTRAVENOUS | Status: AC
  Administered 2023-08-18: 500 mL via INTRAVENOUS

## 2023-08-18 NOTE — ED Provider Notes (Addendum)
 [place] EMERGENCY DEPARTMENT AT [facility] Provider Note   CSN: [id number] Arrival date & time: 08/18/23  1044     History  Chief Complaint  Patient presents with   Abdominal Pain    Paul Hopkins is a 89 y.o. male.  Pt with c/o abdominal pain, and indicates had a diarrhea stool. Pt limited historian, dementia, level 5 caveat. Mild pain, non radiating. No vomiting. No abd distension. No dysuria. No fever. Recent dx covid and is on paxlovid. Denies chest pain or sob.   The history is provided by the patient, medical records and the EMS personnel. The history is limited by the condition of the patient.  Abdominal Pain Associated symptoms: no chest pain, no dysuria, no fever, no shortness of breath and no vomiting        Home Medications Prior to Admission medications   Medication Sig Start Date End Date Taking? Authorizing Provider  acetaminophen (TYLENOL) 325 MG tablet Take 2 tablets (650 mg total) by mouth every 6 (six) hours as needed for mild pain (pain score 1-3) or moderate pain (pain score 4-6) (or Fever >/= 101). 04/05/23   Dorcas Carrow, MD  ARIPiprazole (ABILIFY) 10 MG tablet Take 10 mg by mouth daily.    [provider]  Ascorbic Acid (VITAMIN C PO) Take by mouth.    [provider]  aspirin EC 81 MG tablet Take 81 mg by mouth daily. Swallow whole. 4pm    [provider]  benzonatate (TESSALON) 100 MG capsule Take 1 capsule (100 mg total) by mouth every 8 (eight) hours. 08/15/23   Glyn Ade, MD  levothyroxine (SYNTHROID) 88 MCG tablet Take 88 mcg by mouth daily before breakfast.    [provider]  Multiple Vitamin (MULTI-VITAMIN PO) Take 1 tablet by mouth daily. 4 pm. Centrum silver men 50+    [provider]  Multiple Vitamins-Minerals (PRESERVISION AREDS 2 PO) Take 1 capsule by mouth in the morning and at bedtime.    [provider]  nirmatrelvir/ritonavir (PAXLOVID, 300/100,) 20 x 150 MG  & 10 x 100MG  TBPK Take 3 tablets by mouth 2 (two) times daily for 5 days. Patient GFR is WNL . Take nirmatrelvir (150 mg) two tablets twice daily for 5 days and ritonavir (100 mg) one tablet twice daily for 5 days. 08/15/23 08/20/23  Glyn Ade, MD  ondansetron (ZOFRAN) 4 MG tablet Take 1 tablet (4 mg total) by mouth every 6 (six) hours as needed for nausea or vomiting. 08/15/23   Glyn Ade, MD  Probiotic Product (PROBIOTIC BLEND PO) Take 1 capsule by mouth in the morning and at bedtime.    [provider]  senna-docusate (SENOKOT-S) 8.6-50 MG tablet Take 1 tablet by mouth 2 (two) times daily. 04/05/23   Dorcas Carrow, MD  VITAMIN D PO Take 2,000 Units by mouth daily. 4 pm    [provider]      Allergies    Amoxicillin, Banana, Bean pod extract, Blackberry [rubus fruticosus], Broccoli [brassica oleracea], Carrot oil, Ciprofloxacin, Citric acid, Citrus, Corn-containing products, Egg [egg-derived products], Milk-related compounds, Other, Peanut-containing drug products, Raspberry, Salmon [fish oil], Salmon-oats-squash [alitraq], Strawberry extract, Sulfa antibiotics, Trimethoprim, and Whey    Review of Systems   Review of Systems  Constitutional:  Negative for fever.  Respiratory:  Negative for shortness of breath.   Cardiovascular:  Negative for chest pain.  Gastrointestinal:  Positive for abdominal pain. Negative for blood in stool and vomiting.  Genitourinary:  Negative for  dysuria, flank pain and testicular pain.  Musculoskeletal:  Negative for back pain.  Neurological:  Negative for headaches.    Physical Exam Updated Vital Signs BP (!) 142/54   Pulse 74   Temp 97.7 F (36.5 C) (Oral)   Resp 15   SpO2 98%  Physical Exam Vitals and nursing note reviewed.  Constitutional:      Appearance: Normal appearance. He is well-developed.  HENT:     Head: Atraumatic.     Nose: Nose normal.     Mouth/Throat:     Mouth: Mucous membranes are moist.      Pharynx: Oropharynx is clear.  Eyes:     General: No scleral icterus.    Conjunctiva/sclera: Conjunctivae normal.  Neck:     Trachea: No tracheal deviation.  Cardiovascular:     Rate and Rhythm: Normal rate and regular rhythm.     Pulses: Normal pulses.     Heart sounds: Normal heart sounds. No murmur heard.    No friction rub. No gallop.  Pulmonary:     Effort: Pulmonary effort is normal. No accessory muscle usage or respiratory distress.     Breath sounds: Normal breath sounds.  Abdominal:     General: Bowel sounds are normal. There is no distension.     Palpations: Abdomen is soft. There is no mass.     Tenderness: There is no abdominal tenderness. There is no guarding.  Genitourinary:    Comments: No cva tenderness. Normal external gu exam.  Musculoskeletal:        General: No swelling.     Cervical back: Neck supple.  Skin:    General: Skin is warm and dry.     Findings: No rash.  Neurological:     Mental Status: He is alert.     Comments: Alert, speech clear.   Psychiatric:        Mood and Affect: Mood normal.     ED Results / Procedures / Treatments   Labs (all labs ordered are listed, but only abnormal results are displayed) Results for orders placed or performed during the hospital encounter of 08/18/23  CBC   Collection Time: 08/18/23 11:50 AM  Result Value Ref Range   WBC 3.8 (L) 4.0 - 10.5 K/uL   RBC 4.22 4.22 - 5.81 MIL/uL   Hemoglobin 13.1 13.0 - 17.0 g/dL   HCT 16.1 09.6 - 04.5 %   MCV 93.8 80.0 - 100.0 fL   MCH 31.0 26.0 - 34.0 pg   MCHC 33.1 30.0 - 36.0 g/dL   RDW 40.9 81.1 - 91.4 %   Platelets 182 150 - 400 K/uL   nRBC 0.0 0.0 - 0.2 %  Comprehensive metabolic panel   Collection Time: 08/18/23 11:50 AM  Result Value Ref Range   Sodium 137 135 - 145 mmol/L   Potassium 3.6 3.5 - 5.1 mmol/L   Chloride 104 98 - 111 mmol/L   CO2 28 22 - 32 mmol/L   Glucose, Bld 92 70 - 99 mg/dL   BUN 16 8 - 23 mg/dL   Creatinine, Ser 7.82 0.61 - 1.24 mg/dL    Calcium 8.0 (L) 8.9 - 10.3 mg/dL   Total Protein 5.2 (L) 6.5 - 8.1 g/dL   Albumin 3.0 (L) 3.5 - 5.0 g/dL   AST 31 15 - 41 U/L   ALT 17 0 - 44 U/L   Alkaline Phosphatase 87 38 - 126 U/L   Total Bilirubin 0.5 0.0 - 1.2 mg/dL   GFR,  Estimated >60 >60 mL/min   Anion gap 5 5 - 15  Lipase, blood   Collection Time: 08/18/23 11:50 AM  Result Value Ref Range   Lipase 31 11 - 51 U/L   DG Chest 2 View Result Date: 08/15/2023 CLINICAL DATA:  Shortness of breath. Fever and cough. Generalized weakness. EXAM: CHEST - 2 VIEW COMPARISON:  Chest radiographs 02/13/2020 and 11/27/2019 FINDINGS: Cardiac silhouette and mediastinal contours are within limits. Mild tortuosity of the thoracic aorta. Mild-to-moderate atherosclerotic calcifications within the aortic arch. The lungs are clear. No pleural effusion pneumothorax. Moderate multilevel degenerative disc changes of the thoracic spine. Mild dextrocurvature of the mid to upper thoracic spine. IMPRESSION: No active cardiopulmonary disease. Electronically Signed   By: Neita Garnet M.D.   On: 08/15/2023 11:23     EKG None  Radiology No results found.  Procedures Procedures    Medications Ordered in ED Medications  lactated ringers bolus 500 mL (has no administration in time range)  lactated ringers bolus 1,000 mL (0 mLs Intravenous Stopped 08/18/23 1453)    ED Course/ Medical Decision Making/ A&P                                 Medical Decision Making Problems Addressed: COVID-19 virus infection: acute illness or injury with systemic symptoms Generalized abdominal pain: acute illness or injury with systemic symptoms  Amount and/or Complexity of Data Reviewed External Data Reviewed: labs and notes. Labs: ordered. Decision-making details documented in ED Course.  Risk Decision regarding hospitalization.   Iv ns. Continuous pulse ox and cardiac monitoring. Labs ordered/sent.   Differential diagnosis includes gastritis, pud, pancreatitis,  medication side effect, etc. Dispo decision including potential need for admission considered - will get labs and reassess.   Reviewed nursing notes and prior charts for additional history. External reports reviewed. Additional history from: EMS, family.   Cardiac monitor: sinus rhythm, rate 55.  Labs reviewed/interpreted by me - wbc 4, hgb 13. Lytes unremarkable.   Recheck patient  - denies any abdominal pain. Abd is soft, non tender. Pt denies urinary symptoms. Did receive some ivf in ED.   Po fluids/food.   Recheck, no abd pain, no nvd during period obs in ED.  Breathing comfortably, pulse ox 98%, rr 14. Vitals normal. (Rn indicates 700 cc urine output, clear, light yellow, no malodor/sediment/cloudiness. And pt without gu c/o).  Pt currently appears stable for ED d/c.   Rec close pcp f/u.  Return precautions provided.          Final Clinical Impression(s) / ED Diagnoses Final diagnoses:  Generalized abdominal pain    Rx / DC Orders ED Discharge Orders     None           Cathren Laine, MD 08/18/23 1542

## 2023-08-18 NOTE — ED Triage Notes (Signed)
 Pt BIB [facility] from [facility] with c/o abdominal pain and diarrhea x a few days. Pt recently tested positive for COVID. Aox4 and VSS per EMS. Per EMS pt NSR with episode of bradycardia with rate of 50s. Pt reports sharp abdominal pain all over. Denies SHOB, chest pain. Pt is hard of hearing.

## 2023-08-18 NOTE — Discharge Instructions (Addendum)
 It was our pleasure to provide your ER care today - we hope that you feel better.  Drink plenty of fluids/stay well hydrated.   Follow up closely with primary care doctor in the coming week if symptoms fail to improve/resolve.  Return to ER if worse, new symptoms, new or worsening or severe abdominal pain, persistent vomiting, increased trouble breathing, trouble urinating or pain  urinating, or other concern.

## 2023-08-20 ENCOUNTER — Other Ambulatory Visit: Payer: Self-pay

## 2023-08-20 ENCOUNTER — Inpatient Hospital Stay (HOSPITAL_COMMUNITY)
Admission: EM | Admit: 2023-08-20 | Discharge: 2023-08-23 | DRG: 871 | Disposition: A | Discharge status: Nursing Home (Includes ICF) | Source: Skilled Nursing Facility | Attending: Internal Medicine | Admitting: Internal Medicine

## 2023-08-20 ENCOUNTER — Emergency Department (HOSPITAL_COMMUNITY)

## 2023-08-20 ENCOUNTER — Encounter (HOSPITAL_COMMUNITY): Payer: Self-pay | Admitting: Emergency Medicine

## 2023-08-20 DIAGNOSIS — Z882 Allergy status to sulfonamides status: Secondary | ICD-10-CM

## 2023-08-20 DIAGNOSIS — W19XXXA Unspecified fall, initial encounter: Secondary | ICD-10-CM

## 2023-08-20 DIAGNOSIS — E872 Acidosis, unspecified: Secondary | ICD-10-CM | POA: Diagnosis present

## 2023-08-20 DIAGNOSIS — F01A0 Vascular dementia, mild, without behavioral disturbance, psychotic disturbance, mood disturbance, and anxiety: Secondary | ICD-10-CM | POA: Diagnosis present

## 2023-08-20 DIAGNOSIS — N390 Urinary tract infection, site not specified: Secondary | ICD-10-CM | POA: Insufficient documentation

## 2023-08-20 DIAGNOSIS — Z881 Allergy status to other antibiotic agents status: Secondary | ICD-10-CM

## 2023-08-20 DIAGNOSIS — Z91012 Allergy to eggs: Secondary | ICD-10-CM

## 2023-08-20 DIAGNOSIS — U071 COVID-19: Secondary | ICD-10-CM

## 2023-08-20 DIAGNOSIS — Z7982 Long term (current) use of aspirin: Secondary | ICD-10-CM

## 2023-08-20 DIAGNOSIS — Z8673 Personal history of transient ischemic attack (TIA), and cerebral infarction without residual deficits: Secondary | ICD-10-CM

## 2023-08-20 DIAGNOSIS — N3000 Acute cystitis without hematuria: Secondary | ICD-10-CM

## 2023-08-20 DIAGNOSIS — Z79890 Hormone replacement therapy (postmenopausal): Secondary | ICD-10-CM

## 2023-08-20 DIAGNOSIS — L89102 Pressure ulcer of unspecified part of back, stage 2: Secondary | ICD-10-CM | POA: Diagnosis present

## 2023-08-20 DIAGNOSIS — N3001 Acute cystitis with hematuria: Secondary | ICD-10-CM | POA: Diagnosis present

## 2023-08-20 DIAGNOSIS — R197 Diarrhea, unspecified: Secondary | ICD-10-CM

## 2023-08-20 DIAGNOSIS — A4189 Other specified sepsis: Principal | ICD-10-CM | POA: Diagnosis present

## 2023-08-20 DIAGNOSIS — L89891 Pressure ulcer of other site, stage 1: Secondary | ICD-10-CM | POA: Diagnosis present

## 2023-08-20 DIAGNOSIS — Z8616 Personal history of COVID-19: Secondary | ICD-10-CM

## 2023-08-20 DIAGNOSIS — Z91010 Allergy to peanuts: Secondary | ICD-10-CM

## 2023-08-20 DIAGNOSIS — E039 Hypothyroidism, unspecified: Secondary | ICD-10-CM | POA: Diagnosis present

## 2023-08-20 DIAGNOSIS — L8990 Pressure ulcer of unspecified site, unspecified stage: Secondary | ICD-10-CM | POA: Insufficient documentation

## 2023-08-20 DIAGNOSIS — Z91011 Allergy to milk products: Secondary | ICD-10-CM

## 2023-08-20 DIAGNOSIS — Z800 Family history of malignant neoplasm of digestive organs: Secondary | ICD-10-CM

## 2023-08-20 DIAGNOSIS — E876 Hypokalemia: Secondary | ICD-10-CM | POA: Diagnosis present

## 2023-08-20 DIAGNOSIS — Z883 Allergy status to other anti-infective agents status: Secondary | ICD-10-CM

## 2023-08-20 DIAGNOSIS — Z66 Do not resuscitate: Secondary | ICD-10-CM | POA: Diagnosis present

## 2023-08-20 DIAGNOSIS — A0811 Acute gastroenteropathy due to Norwalk agent: Secondary | ICD-10-CM | POA: Diagnosis present

## 2023-08-20 DIAGNOSIS — B964 Proteus (mirabilis) (morganii) as the cause of diseases classified elsewhere: Secondary | ICD-10-CM | POA: Diagnosis present

## 2023-08-20 DIAGNOSIS — N3090 Cystitis, unspecified without hematuria: Secondary | ICD-10-CM

## 2023-08-20 DIAGNOSIS — A419 Sepsis, unspecified organism: Principal | ICD-10-CM

## 2023-08-20 DIAGNOSIS — Z880 Allergy status to penicillin: Secondary | ICD-10-CM

## 2023-08-20 DIAGNOSIS — Z79899 Other long term (current) drug therapy: Secondary | ICD-10-CM

## 2023-08-20 DIAGNOSIS — E860 Dehydration: Secondary | ICD-10-CM | POA: Diagnosis present

## 2023-08-20 DIAGNOSIS — Z91018 Allergy to other foods: Secondary | ICD-10-CM

## 2023-08-20 DIAGNOSIS — G9341 Metabolic encephalopathy: Secondary | ICD-10-CM | POA: Diagnosis present

## 2023-08-20 DIAGNOSIS — Z91013 Allergy to seafood: Secondary | ICD-10-CM

## 2023-08-20 LAB — CBC WITH DIFFERENTIAL/PLATELET
Abs Immature Granulocytes: 0.04 10*3/uL (ref 0.00–0.07)
Basophils Absolute: 0 10*3/uL (ref 0.0–0.1)
Basophils Relative: 0 %
Eosinophils Absolute: 0 10*3/uL (ref 0.0–0.5)
Eosinophils Relative: 0 %
HCT: 40.8 % (ref 39.0–52.0)
Hemoglobin: 13.8 g/dL (ref 13.0–17.0)
Immature Granulocytes: 0 %
Lymphocytes Relative: 2 %
Lymphs Abs: 0.3 10*3/uL — ABNORMAL LOW (ref 0.7–4.0)
MCH: 31 pg (ref 26.0–34.0)
MCHC: 33.8 g/dL (ref 30.0–36.0)
MCV: 91.7 fL (ref 80.0–100.0)
Monocytes Absolute: 0.7 10*3/uL (ref 0.1–1.0)
Monocytes Relative: 5 %
Neutro Abs: 13.1 10*3/uL — ABNORMAL HIGH (ref 1.7–7.7)
Neutrophils Relative %: 93 %
Platelets: 208 10*3/uL (ref 150–400)
RBC: 4.45 MIL/uL (ref 4.22–5.81)
RDW: 13.4 % (ref 11.5–15.5)
WBC: 14.1 10*3/uL — ABNORMAL HIGH (ref 4.0–10.5)
nRBC: 0 % (ref 0.0–0.2)

## 2023-08-20 LAB — URINALYSIS, W/ REFLEX TO CULTURE (INFECTION SUSPECTED)
Bilirubin Urine: NEGATIVE
Glucose, UA: NEGATIVE mg/dL
Hgb urine dipstick: NEGATIVE
Ketones, ur: NEGATIVE mg/dL
Nitrite: POSITIVE — AB
Protein, ur: 100 mg/dL — AB
Specific Gravity, Urine: 1.017 (ref 1.005–1.030)
WBC, UA: >50 WBC/hpf (ref 0–5)
pH: 8 (ref 5.0–8.0)

## 2023-08-20 LAB — I-STAT VENOUS BLOOD GAS, ED
Acid-Base Excess: 4 mmol/L — ABNORMAL HIGH (ref 0.0–2.0)
Bicarbonate: 28.1 mmol/L — ABNORMAL HIGH (ref 20.0–28.0)
Calcium, Ion: 1.09 mmol/L — ABNORMAL LOW (ref 1.15–1.40)
HCT: 41 % (ref 39.0–52.0)
Hemoglobin: 13.9 g/dL (ref 13.0–17.0)
O2 Saturation: 39 %
Potassium: 3.9 mmol/L (ref 3.5–5.1)
Sodium: 137 mmol/L (ref 135–145)
TCO2: 29 mmol/L (ref 22–32)
pCO2, Ven: 41 mmHg — ABNORMAL LOW (ref 44–60)
pH, Ven: 7.444 — ABNORMAL HIGH (ref 7.25–7.43)
pO2, Ven: 22 mmHg — CL (ref 32–45)

## 2023-08-20 LAB — PROTIME-INR
INR: 1.2 (ref 0.8–1.2)
Prothrombin Time: 15.2 s (ref 11.4–15.2)

## 2023-08-20 LAB — COMPREHENSIVE METABOLIC PANEL
ALT: 17 U/L (ref 0–44)
AST: 30 U/L (ref 15–41)
Albumin: 3.3 g/dL — ABNORMAL LOW (ref 3.5–5.0)
Alkaline Phosphatase: 97 U/L (ref 38–126)
Anion gap: 9 (ref 5–15)
BUN: 15 mg/dL (ref 8–23)
CO2: 26 mmol/L (ref 22–32)
Calcium: 8.4 mg/dL — ABNORMAL LOW (ref 8.9–10.3)
Chloride: 100 mmol/L (ref 98–111)
Creatinine, Ser: 0.87 mg/dL (ref 0.61–1.24)
GFR, Estimated: >60 mL/min (ref >60)
Glucose, Bld: 116 mg/dL — ABNORMAL HIGH (ref 70–99)
Potassium: 4 mmol/L (ref 3.5–5.1)
Sodium: 135 mmol/L (ref 135–145)
Total Bilirubin: 1.1 mg/dL (ref 0.0–1.2)
Total Protein: 5.7 g/dL — ABNORMAL LOW (ref 6.5–8.1)

## 2023-08-20 LAB — PROCALCITONIN: Procalcitonin: 0.2 ng/mL

## 2023-08-20 LAB — MRSA NEXT GEN BY PCR, NASAL: MRSA by PCR Next Gen: NOT DETECTED

## 2023-08-20 LAB — C DIFFICILE QUICK SCREEN W PCR REFLEX
C Diff antigen: NEGATIVE
C Diff interpretation: NOT DETECTED
C Diff toxin: NEGATIVE

## 2023-08-20 LAB — I-STAT CG4 LACTIC ACID, ED
Lactic Acid, Venous: 2.1 mmol/L (ref 0.5–1.9)
Lactic Acid, Venous: 3.1 mmol/L (ref 0.5–1.9)
Lactic Acid, Venous: 2.2 mmol/L (ref 0.5–1.9)

## 2023-08-20 LAB — APTT: aPTT: 30 s (ref 24–36)

## 2023-08-20 MED ORDER — LEVOTHYROXINE SODIUM 88 MCG PO TABS
88.0000 ug | ORAL_TABLET | Freq: Every day | ORAL | Status: DC
  Administered 2023-08-21 – 2023-08-23 (×3): 88 ug via ORAL
  Filled 2023-08-20 (×3): qty 1

## 2023-08-20 MED ORDER — LACTATED RINGERS IV SOLN: INTRAVENOUS | Status: AC

## 2023-08-20 MED ORDER — LACTATED RINGERS IV BOLUS
1000.0000 mL | Freq: Once | INTRAVENOUS | Status: AC
  Administered 2023-08-20: 1000 mL via INTRAVENOUS

## 2023-08-20 MED ORDER — ACETAMINOPHEN 325 MG PO TABS
650.0000 mg | ORAL_TABLET | Freq: Once | ORAL | Status: AC
  Administered 2023-08-20: 650 mg via ORAL
  Filled 2023-08-20: qty 2

## 2023-08-20 MED ORDER — SODIUM CHLORIDE 0.9 % IV SOLN
1.0000 g | INTRAVENOUS | Status: DC
  Administered 2023-08-20 – 2023-08-21 (×2): 1 g via INTRAVENOUS

## 2023-08-20 MED ORDER — RIVAROXABAN 10 MG PO TABS
10.0000 mg | ORAL_TABLET | Freq: Every day | ORAL | Status: DC
  Administered 2023-08-20 – 2023-08-21 (×2): 10 mg via ORAL
  Filled 2023-08-20 (×2): qty 1

## 2023-08-20 MED ORDER — VANCOMYCIN HCL IN DEXTROSE 1-5 GM/200ML-% IV SOLN
1000.0000 mg | Freq: Once | INTRAVENOUS | Status: AC
  Administered 2023-08-20: 1000 mg via INTRAVENOUS
  Filled 2023-08-20: qty 200

## 2023-08-20 MED ORDER — METRONIDAZOLE 500 MG/100ML IV SOLN
500.0000 mg | Freq: Once | INTRAVENOUS | Status: AC
  Administered 2023-08-20: 500 mg via INTRAVENOUS
  Filled 2023-08-20: qty 100

## 2023-08-20 MED ORDER — SODIUM CHLORIDE 0.9 % IV SOLN
2.0000 g | Freq: Once | INTRAVENOUS | Status: AC
  Administered 2023-08-20: 2 g via INTRAVENOUS
  Filled 2023-08-20: qty 12.5

## 2023-08-20 MED ORDER — ARIPIPRAZOLE 10 MG PO TABS
10.0000 mg | ORAL_TABLET | Freq: Every day | ORAL | Status: DC
  Administered 2023-08-20 – 2023-08-23 (×4): 10 mg via ORAL
  Filled 2023-08-20 (×4): qty 1

## 2023-08-20 MED ORDER — LACTATED RINGERS IV BOLUS (SEPSIS)
1000.0000 mL | Freq: Once | INTRAVENOUS | Status: AC
  Administered 2023-08-20: 1000 mL via INTRAVENOUS

## 2023-08-20 NOTE — ED Triage Notes (Signed)
 Pt to ER via EMS from [facility] with reports of diarrhea throughout the day yesterday.  Pt had a fall last night and was assessed by EMS.  This AM staff reports that pt is altered from normal, unable to sit up in bed and not taking medications.  EMS reports temp of 101, HR 90s, RR 40s ETCO2 26-32.

## 2023-08-20 NOTE — H&P (Signed)
 Date: 08/20/2023               Paul Hopkins Name:  Paul Hopkins MRN: [id number]  DOB: [birth year] Age / Sex: 89 y.o., male   PCP: Eloisa Northern, MD         Medical Service: Internal Medicine Teaching Service         Attending Physician: Dr. Mercie Eon, MD      First Contact: Dr. Kathleen Lime, MD    Second Contact: Dr. Marrianne Mood, MD          After Hours (After 5p/  First Contact Pager: [phone]  weekends / holidays): Second Contact Pager: [phone]   SUBJECTIVE   Chief Complaint: Altered mental status   History of Present Illness: Paul Hopkins is an 89 YO M with a PMH of dementia and hypothyroidism presenting to the ED with altered mental status and admitted for acute encephalopathy.  History complicated by Paul Hopkins's baseline dementia and presenting altered mental status.  Paul Hopkins has had profound diarrhea for the past week.  Per EMS, Paul Hopkins has had to be changed nearly every hour during this time.  Paul Hopkins had a fall earlier in the night as Paul Hopkins was moving to his walker.  Per CNA who takes care of the Paul Hopkins at the facility, the Paul Hopkins was also lethargic upon awakening this morning which was unusual.  Paul Hopkins was too weak to sit up independently.  She is unclear what his baseline but believes it is alert and oriented x 3 with slight dementia, which was consistent with his exam today.  Paul Hopkins denies any headache, abdominal pain, chest pain, or troubles breathing.  ED Course: Temp 103.34F UA +nitrites and leukocytes Hip xray negative Given cefepime LR 2L Leukocytosis Elevated lactate Wbc 14.1 GI panel Urine culture  Meds:  Current Outpatient Medications  Medication Instructions   acetaminophen (TYLENOL) 650 mg, Oral, Every 6 hours PRN   ARIPiprazole (ABILIFY) 10 mg, Oral, Daily   aspirin EC 81 mg, Oral, Daily, Swallow whole. 4pm   benzonatate (TESSALON) 100 mg, Oral, Every 8 hours   Cholecalciferol (D3 2000) 50 MCG (2000 UT) CAPS 1 capsule, Oral, Daily   levothyroxine  (SYNTHROID) 88 mcg, Oral, Daily before breakfast   Multiple Vitamin (MULTI-VITAMIN PO) 1 tablet, Oral, Daily, 4 pm. Centrum silver men 50+   Multiple Vitamins-Minerals (PRESERVISION AREDS 2 PO) 1 capsule, Oral, 2 times daily   nirmatrelvir/ritonavir (PAXLOVID, 300/100,) 20 x 150 MG & 10 x 100MG  TBPK 3 tablets, Oral, 2 times daily, Paul Hopkins GFR is WNL . Take nirmatrelvir (150 mg) two tablets twice daily for 5 days and ritonavir (100 mg) one tablet twice daily for 5 days.   ondansetron (ZOFRAN) 4 mg, Oral, Every 6 hours PRN   Probiotic Product (PROBIOTIC BLEND PO) 1 capsule, Oral, 2 times daily   vitamin C 1,000 mg, Oral, Daily   Past Medical History Past Medical History:  Diagnosis Date   Bradycardia    Delusional disorder (HCC)    Dementia (HCC)    Hypothyroidism    Hypothyroidism    Meniere disease    Non-pressure chronic ulcer of buttock (HCC)    Osteoarthritis    Stroke (HCC)    Vascular dementia (HCC)     Past Surgical History:  Procedure Laterality Date   EXCISIONAL HEMORRHOIDECTOMY     Social:  Lives With: [place] at [employer] Occupation: Retired, former Art gallery manager  Level of Function: Daughter nearby, largely independent of iADLs PCP: Eloisa Northern, MD   Family History: Family  History  Problem Relation Age of Onset   Colon cancer Father    Allergies: Allergies as of 08/20/2023 - Review Complete 08/20/2023  Allergen Reaction Noted   Amoxicillin Other (See Comments) 10/14/2019   Banana Other (See Comments) 11/27/2019   Bean pod extract Diarrhea and Other (See Comments) 11/27/2019   Blackberry [rubus fruticosus] Other (See Comments) 11/27/2019   Broccoli [place] oleracea]  04/01/2023   Carrot oil Diarrhea and Other (See Comments) 11/27/2019   Ciprofloxacin Other (See Comments) 10/14/2019   Citric acid Diarrhea and Other (See Comments) 11/27/2019   Citrus Diarrhea and Other (See Comments) 11/27/2019   Corn-containing products Other (See Comments) 11/27/2019    Egg [egg-derived products] Diarrhea and Other (See Comments) 11/27/2019   Milk-related compounds Diarrhea and Other (See Comments) 11/27/2019   Other Diarrhea and Other (See Comments) 11/27/2019   Peanut-containing drug products Diarrhea and Other (See Comments) 11/27/2019   Raspberry Other (See Comments) 11/27/2019   Salmon [fish oil]  04/01/2023   Salmon-oats-squash [alitraq]  04/01/2023   Strawberry extract Other (See Comments) 11/27/2019   Sulfa antibiotics Other (See Comments) 10/14/2019   Trimethoprim Other (See Comments) 10/14/2019   Whey Diarrhea and Other (See Comments) 11/27/2019   Review of Systems: A complete ROS was negative except as per HPI.   OBJECTIVE:   Physical Exam: Blood pressure 125/61, pulse 75, temperature (!) 100.8 F (38.2 C), temperature source Rectal, resp. rate (!) 23, height 6' (1.829 m), weight 70 kg, SpO2 100%.  Physical Exam Cardiovascular:     Rate and Rhythm: Normal rate and regular rhythm.  Pulmonary:     Effort: Tachypnea present.     Breath sounds: Normal breath sounds.  Abdominal:     General: Abdomen is flat. Bowel sounds are normal.     Palpations: Abdomen is soft.  Skin:    General: Skin is warm.  Neurological:     General: No focal deficit present.     Mental Status: Paul Hopkins is lethargic.  Psychiatric:        Attention and Perception: Attention and perception normal.    Labs: CBC    Component Value Date/Time   WBC 14.1 (H) 08/20/2023 0915   RBC 4.45 08/20/2023 0915   HGB 13.9 08/20/2023 0922   HCT 41.0 08/20/2023 0922   PLT 208 08/20/2023 0915   MCV 91.7 08/20/2023 0915   MCH 31.0 08/20/2023 0915   MCHC 33.8 08/20/2023 0915   RDW 13.4 08/20/2023 0915   LYMPHSABS 0.3 (L) 08/20/2023 0915   MONOABS 0.7 08/20/2023 0915   EOSABS 0.0 08/20/2023 0915   BASOSABS 0.0 08/20/2023 0915     CMP     Component Value Date/Time   NA 137 08/20/2023 0922   K 3.9 08/20/2023 0922   CL 100 08/20/2023 0915   CO2 26 08/20/2023 0915    GLUCOSE 116 (H) 08/20/2023 0915   BUN 15 08/20/2023 0915   CREATININE 0.87 08/20/2023 0915   CALCIUM 8.4 (L) 08/20/2023 0915   PROT 5.7 (L) 08/20/2023 0915   ALBUMIN 3.3 (L) 08/20/2023 0915   AST 30 08/20/2023 0915   ALT 17 08/20/2023 0915   ALKPHOS 97 08/20/2023 0915   BILITOT 1.1 08/20/2023 0915   GFRNONAA >60 08/20/2023 0915   GFRAA >60 02/13/2020 1931   Imaging:  Portable Chest 1 View: IMPRESSION: No active disease.  Portable Pelvis 1-2 Views:  IMPRESSION: There is linear sclerosis along the LEFT subtrochanteric femur. This is nonspecific and could a nondisplaced fracture versus summation artifact. Recommend  dedicated hip radiographs for further evaluation.  DG Hip 2-3 V Left IMPRESSION: Negative.  CT Head WO Contrast: IMPRESSION: 1. No acute intracranial abnormality or acute traumatic injury identified. 2. 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 3. New moderate paranasal sinus inflammation.  CT Cervical Spine WO Contrast: IMPRESSION: 1. No acute traumatic injury identified in the cervical spine. 2. Chronic cervical spine degeneration not significantly changed since 2021.  EKG: personally reviewed my interpretation is NSR.   ASSESSMENT & PLAN:   Assessment & Plan by Problem: Principal Problem:   Acute metabolic encephalopathy  Paul Hopkins is an 89 YO M with a PMH of dementia and hypothyroidism presenting to the ED with altered mental status and admitted for acute encephalopathy.   #Acute Metabolic Encephalopathy #UTI #Fall Presented with fever of 100.8 and respiratory rate of 31.  Paul Hopkins also had a leukocytosis of 14.1, which is much greater than his white blood cell count of 3.8 two days ago.  Procalcitonin and chest x-ray unremarkable so unlikely to be pneumonia.  Lactic acid elevated at 2.1 with peak of 3.1.  LA improved after fluids back to 2.2.  UA notable for positive  nitrites and moderate leukocytes consistent with a UTI.  Suspect encephalopathy likely from his UTI.  Blood cultures sent and given dose of Flagyl, cefepime, and ceftriaxone.  Paul Hopkins also endorsing diarrhea for the past week and is likely hypovolemic so we will continue fluid supplementation.  At baseline per daughter, Paul Hopkins is largely independent.  Will evaluate with PT/OT. - Continue ceftriaxone - Follow-up lactic acid - Continue LR 150 mL/h for 20 hours - Trend CBC and monitor fever curve - Follow-up PT/OT  #Hypothyroidism Continue on home synthroid 88 mcg daily  #Vascular Dementia Continue home Abilify 10 mg daily   Diet: Normal VTE:  Xarelto IVF: LR, 150 mL/h x 20 hr Code: DNR  Prior to Admission Living Arrangement:  [place] at [facility] Anticipated Discharge Location:  See above Barriers to Discharge: Encephalopathy  Dispo: Admit Paul Hopkins to Inpatient with expected length of stay greater than 2 midnights.  Signed: Morrie Sheldon, MD Internal Medicine Resident PGY-1  08/20/2023, 4:31 PM

## 2023-08-20 NOTE — ED Notes (Signed)
 Patient transported to CT

## 2023-08-20 NOTE — Sepsis Progress Note (Signed)
 Elink following code sepsis

## 2023-08-20 NOTE — ED Provider Notes (Signed)
 [place] EMERGENCY DEPARTMENT AT [facility] Provider Note   CSN: [id number] Arrival date & time: 08/20/23  0844     History  Chief Complaint  Patient presents with   Altered Mental Status   Diarrhea    Paul Hopkins is a 89 y.o. male.  Patient is an 89 year old male with PMH dementia, hypothyroidism and recent COVID diagnosis on Paxil bid presenting to the emergency department with altered mental status.  Per EMS the patient has had significant diarrhea throughout the week and has to be changed almost every hour.  He states that he did have a fall earlier in the night and was evaluated by EMS but not brought into the hospital.  I spoke with the CNA that helps take care of him from the facility who states that the reported fall was around 5 AM but she did not receive any details regarding the fall.  She states that when she got there this morning he was asleep in bed which is unusual with him and she tried to wake him up that he seemed very lethargic upon awakening and she tried to sit him up in bed but he was too weak to sit up on his own.  He is she states that he asked if he had a fever.  She denies any known recent antibiotics or hospital admissions.  She is not sure of his baseline but thinks he is ANO x 3 with only mild dementia.  The patient here states that he has had a cough productive of mucus.  Denies any shortness of breath.  States he has also been nauseous and vomiting.  Denies any abdominal pain.  The history is provided by the patient and the nursing home. The history is limited by the condition of the patient (Dementia).  Altered Mental Status Diarrhea      Home Medications Prior to Admission medications   Medication Sig Start Date End Date Taking? Authorizing Provider  acetaminophen (TYLENOL) 325 MG tablet Take 2 tablets (650 mg total) by mouth every 6 (six) hours as needed for mild pain (pain score 1-3) or moderate pain (pain score 4-6) (or Fever >/=  101). 04/05/23  Yes Dorcas Carrow, MD  ARIPiprazole (ABILIFY) 10 MG tablet Take 10 mg by mouth daily.   Yes [provider]  Ascorbic Acid (VITAMIN C) 1000 MG tablet Take 1,000 mg by mouth daily.   Yes [provider]  aspirin EC 81 MG tablet Take 81 mg by mouth daily. Swallow whole. 4pm   Yes [provider]  Cholecalciferol (D3 2000) 50 MCG (2000 UT) CAPS Take 1 capsule by mouth daily.   Yes [provider]  levothyroxine (SYNTHROID) 88 MCG tablet Take 88 mcg by mouth daily before breakfast.   Yes [provider]  Multiple Vitamin (MULTI-VITAMIN PO) Take 1 tablet by mouth daily. 4 pm. Centrum silver men 50+   Yes [provider]  Multiple Vitamins-Minerals (PRESERVISION AREDS 2 PO) Take 1 capsule by mouth 2 (two) times daily.   Yes [provider]  nirmatrelvir/ritonavir (PAXLOVID, 300/100,) 20 x 150 MG & 10 x 100MG  TBPK Take 3 tablets by mouth 2 (two) times daily for 5 days. Patient GFR is WNL . Take nirmatrelvir (150 mg) two tablets twice daily for 5 days and ritonavir (100 mg) one tablet twice daily for 5 days. 08/15/23 08/20/23 Yes Countryman, Almeta Monas, MD  ondansetron (ZOFRAN) 4 MG tablet Take 1 tablet (4 mg total) by mouth every  6 (six) hours as needed for nausea or vomiting. 08/15/23  Yes Glyn Ade, MD  Probiotic Product (PROBIOTIC BLEND PO) Take 1 capsule by mouth in the morning and at bedtime.   Yes [provider]  benzonatate (TESSALON) 100 MG capsule Take 1 capsule (100 mg total) by mouth every 8 (eight) hours. Patient not taking: Reported on 08/20/2023 08/15/23   Glyn Ade, MD      Allergies    Amoxicillin, Banana, Bean pod extract, Blackberry [rubus fruticosus], Broccoli [brassica oleracea], Carrot oil, Ciprofloxacin, Citric acid, Citrus, Corn-containing products, Egg [egg-derived products], Milk-related compounds, Other, Peanut-containing drug products, Raspberry, Salmon [fish oil], Salmon-oats-squash  [alitraq], Strawberry extract, Sulfa antibiotics, Trimethoprim, and Whey    Review of Systems   Review of Systems  Gastrointestinal:  Positive for diarrhea.    Physical Exam Updated Vital Signs BP (!) 111/59   Pulse 70   Temp (!) 100.8 F (38.2 C) (Rectal)   Resp (!) 31   Ht 6' (1.829 m)   Wt 70 kg   SpO2 96%   BMI 20.93 kg/m  Physical Exam Vitals and nursing note reviewed.  Constitutional:      General: He is not in acute distress.    Appearance: Normal appearance. He is ill-appearing.  HENT:     Head: Normocephalic and atraumatic.     Nose: Nose normal.     Mouth/Throat:     Mouth: Mucous membranes are dry.     Pharynx: Oropharynx is clear.  Eyes:     Extraocular Movements: Extraocular movements intact.     Conjunctiva/sclera: Conjunctivae normal.     Pupils: Pupils are equal, round, and reactive to light.  Cardiovascular:     Rate and Rhythm: Normal rate and regular rhythm.     Heart sounds: Normal heart sounds.  Pulmonary:     Effort: No respiratory distress.     Breath sounds: Rhonchi (Bilaterally) present.     Comments: Tachypneic but no increased WOB Abdominal:     General: Abdomen is flat.     Palpations: Abdomen is soft.     Tenderness: There is no abdominal tenderness.  Musculoskeletal:        General: Normal range of motion.     Cervical back: Normal range of motion.     Right lower leg: No edema.     Left lower leg: No edema.  Skin:    General: Skin is warm and dry.  Neurological:     General: No focal deficit present.     Mental Status: He is alert.     Comments: Oriented to person and place only   Psychiatric:        Mood and Affect: Mood normal.        Behavior: Behavior normal.     ED Results / Procedures / Treatments   Labs (all labs ordered are listed, but only abnormal results are displayed) Labs Reviewed  COMPREHENSIVE METABOLIC PANEL - Abnormal; Notable for the following components:      Result Value   Glucose, Bld 116 (*)     Calcium 8.4 (*)    Total Protein 5.7 (*)    Albumin 3.3 (*)    All other components within normal limits  CBC WITH DIFFERENTIAL/PLATELET - Abnormal; Notable for the following components:   WBC 14.1 (*)    Neutro Abs 13.1 (*)    Lymphs Abs 0.3 (*)    All other components within normal limits  URINALYSIS, W/ REFLEX TO CULTURE (INFECTION SUSPECTED) -  Abnormal; Notable for the following components:   Color, Urine AMBER (*)    APPearance CLOUDY (*)    Protein, ur 100 (*)    Nitrite POSITIVE (*)    Leukocytes,Ua MODERATE (*)    Bacteria, UA RARE (*)    All other components within normal limits  I-STAT CG4 LACTIC ACID, ED - Abnormal; Notable for the following components:   Lactic Acid, Venous 2.1 (*)    All other components within normal limits  I-STAT VENOUS BLOOD GAS, ED - Abnormal; Notable for the following components:   pH, Ven 7.444 (*)    pCO2, Ven 41.0 (*)    pO2, Ven 22 (*)    Bicarbonate 28.1 (*)    Acid-Base Excess 4.0 (*)    Calcium, Ion 1.09 (*)    All other components within normal limits  I-STAT CG4 LACTIC ACID, ED - Abnormal; Notable for the following components:   Lactic Acid, Venous 3.1 (*)    All other components within normal limits  I-STAT CG4 LACTIC ACID, ED - Abnormal; Notable for the following components:   Lactic Acid, Venous 2.2 (*)    All other components within normal limits  C DIFFICILE QUICK SCREEN W PCR REFLEX    CULTURE, BLOOD (ROUTINE X 2)  CULTURE, BLOOD (ROUTINE X 2)  GASTROINTESTINAL PANEL BY PCR, STOOL (REPLACES STOOL CULTURE)  URINE CULTURE  MRSA NEXT GEN BY PCR, NASAL  PROTIME-INR  APTT  PROCALCITONIN    EKG EKG Interpretation Date/Time:  Sunday August 20 2023 09:21:01 EDT Ventricular Rate:  80 PR Interval:  177 QRS Duration:  154 QT Interval:  402 QTC Calculation: 464 R Axis:   -78  Text Interpretation: Sinus rhythm Atrial premature complex RBBB and LAFB Left ventricular hypertrophy Lateral leads are also involved No significant  change since last tracing Confirmed by Elayne Snare ([id number]) on 08/20/2023 9:26:22 AM  Radiology CT Cervical Spine Wo Contrast Result Date: 08/20/2023 CLINICAL DATA:  89 year old male with altered mental status. Diarrhea, fall. Fever 101 F EXAM: CT CERVICAL SPINE WITHOUT CONTRAST TECHNIQUE: Multidetector CT imaging of the cervical spine was performed without intravenous contrast. Multiplanar CT image reconstructions were also generated. RADIATION DOSE REDUCTION: This exam was performed according to the departmental dose-optimization program which includes automated exposure control, adjustment of the mA and/or kV according to patient size and/or use of iterative reconstruction technique. COMPARISON:  Head CT today reported separately. CTA head and neck 02/13/2020. FINDINGS: Alignment: Stable and mildly exaggerated cervical lordosis. Cervicothoracic junction alignment is within normal limits. Bilateral posterior element alignment is within normal limits. Skull base and vertebrae: Stable bone mineralization since 2021. Visualized skull base is intact. No atlanto-occipital dissociation. C1 and C2 appear intact and aligned. No acute osseous abnormality identified. Soft tissues and spinal canal: No prevertebral fluid or swelling. No visible canal hematoma. Negative visible noncontrast neck soft tissues, small volume right IJ gas likely related to recent intravenous access. Disc levels: Bulky chronic cervical ligament flavum degeneration, hypertrophy which is sometimes calcified. Chronic disc and endplate degeneration. Subtle degenerative appearing anterolisthesis of C4 on C5. This is not significantly changed since 2021 and underlying spinal canal fairly capacious. Mild if any associated spinal stenosis. Upper chest: Osteopenia. Visible upper thoracic levels appear intact. Negative lung apices. IMPRESSION: 1. No acute traumatic injury identified in the cervical spine. 2. Chronic cervical spine degeneration not  significantly changed since 2021. Electronically Signed   By: Odessa Fleming M.D.   On: 08/20/2023 11:17   CT Head Wo Contrast  Result Date: 08/20/2023 CLINICAL DATA:  89 year old male with altered mental status. Diarrhea, fall. Fever 101 F EXAM: CT HEAD WITHOUT CONTRAST TECHNIQUE: Contiguous axial images were obtained from the base of the skull through the vertex without intravenous contrast. RADIATION DOSE REDUCTION: This exam was performed according to the departmental dose-optimization program which includes automated exposure control, adjustment of the mA and/or kV according to patient size and/or use of iterative reconstruction technique. COMPARISON:  Brain MRI 10/28/2021.  Head CT 10/27/2021. FINDINGS: Brain: Cerebral volume not significantly changed. No midline shift, ventriculomegaly, mass effect, evidence of mass lesion, intracranial hemorrhage or evidence of cortically based acute infarction. Patchy and confluent chronic bilateral white matter disease does not appear significantly changed from 2023, including involvement of the right external capsule. Stable gray-white differentiation. Vascular: Chronic severe intracranial artery dolichoectasia, especially the basilar artery. Extensive Calcified atherosclerosis at the skull base. No suspicious intracranial vascular hyperdensity. Skull: Intact.  No acute osseous abnormality identified. Sinuses/Orbits: Moderate new paranasal sinus mucosal thickening most pronounced in the ethmoids. Tympanic cavities and mastoids remain clear. Other: No orbit or scalp soft tissue injury identified. IMPRESSION: 1. No acute intracranial abnormality or acute traumatic injury identified. 2. 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 3. New moderate paranasal sinus inflammation. Electronically Signed   By: Odessa Fleming M.D.   On: 08/20/2023 11:13   DG Hip Unilat With Pelvis 2-3 Views Left Result Date:  08/20/2023 CLINICAL DATA:  Fall. EXAM: DG HIP (WITH OR WITHOUT PELVIS) 2-3V LEFT COMPARISON:  None Available. FINDINGS: There is no evidence of hip fracture or dislocation. There is no evidence of arthropathy or other focal bone abnormality. IMPRESSION: Negative. Electronically Signed   By: Kennith Center M.D.   On: 08/20/2023 10:58   DG Pelvis Portable Result Date: 08/20/2023 CLINICAL DATA:  fall EXAM: PORTABLE PELVIS 1-2 VIEWS COMPARISON:  July 11, 2023 FINDINGS: Osteopenia. There is linear sclerosis along the LEFT subtrochanteric femur. No definitive displaced fracture is visualized. Degenerative changes of the lower lumbar spine. Sacrum is obscured by overlapping bowel contents. Vascular calcifications. IMPRESSION: There is linear sclerosis along the LEFT subtrochanteric femur. This is nonspecific and could a nondisplaced fracture versus summation artifact. Recommend dedicated hip radiographs for further evaluation. Electronically Signed   By: Meda Klinefelter M.D.   On: 08/20/2023 09:49   DG Chest Port 1 View Result Date: 08/20/2023 CLINICAL DATA:  Diarrhea, fall, questionable sepsis EXAM: PORTABLE CHEST 1 VIEW COMPARISON:  08/15/2023 chest radiograph. FINDINGS: Stable cardiomediastinal silhouette with normal heart size. No pneumothorax. No pleural effusion. Lungs appear clear, with no acute consolidative airspace disease and no pulmonary edema. IMPRESSION: No active disease. Electronically Signed   By: Delbert Phenix M.D.   On: 08/20/2023 09:46    Procedures .Critical Care  Performed by: Rexford Maus, DO Authorized by: Rexford Maus, DO   Critical care provider statement:    Critical care time (minutes):  30   Critical care was necessary to treat or prevent imminent or life-threatening deterioration of the following conditions:  Sepsis   Critical care was time spent personally by me on the following activities:  Development of treatment plan with patient or surrogate, discussions  with consultants, evaluation of patient's response to treatment, examination of patient, ordering and review of laboratory studies, ordering and review of radiographic studies, ordering and performing treatments and interventions, pulse oximetry, re-evaluation of patient's condition and review of old charts  Medications Ordered in ED Medications  lactated ringers infusion ( Intravenous New Bag/Given 08/20/23 0925)  lactated ringers bolus 1,000 mL (0 mLs Intravenous Stopped 08/20/23 1013)  ceFEPIme (MAXIPIME) 2 g in sodium chloride 0.9 % 100 mL IVPB (0 g Intravenous Stopped 08/20/23 1007)  metroNIDAZOLE (FLAGYL) IVPB 500 mg (0 mg Intravenous Stopped 08/20/23 1031)  vancomycin (VANCOCIN) IVPB 1000 mg/200 mL premix (0 mg Intravenous Stopped 08/20/23 1031)  acetaminophen (TYLENOL) tablet 650 mg (650 mg Oral Given 08/20/23 0934)  lactated ringers bolus 1,000 mL (0 mLs Intravenous Stopped 08/20/23 1242)    ED Course/ Medical Decision Making/ A&P Clinical Course as of 08/20/23 1439  Sun Aug 20, 2023  1028 Labs with increasing leukocytosis and mildly elevated lactic, no severe dehydration. [VK]  1029 Possible fracture in L hip on pelvis XR, will have dedicated L hip x-rays. [VK]  1200 Lactic uptrending. UA consistent with UTI. CT reads pending.  [VK]  1317 CT without acute traumatic injury. Patient requires admission for sepsis from UTI. [VK]    Clinical Course User Index [VK] Rexford Maus, DO                                 Medical Decision Making This patient presents to the ED with chief complaint(s) of AMS, diarrhea, fall with pertinent past medical history of dementia, hypothyroidism, recent COVID infection which further complicates the presenting complaint. The complaint involves an extensive differential diagnosis and also carries with it a high risk of complications and morbidity.    The differential diagnosis includes recent fall concern for ICH or mass effect, cervical spine fracture,  sepsis, pneumonia, pneumothorax, pulmonary edema, pleural effusion, dehydration, electrolyte abnormality, C. difficile or other infectious diarrhea, viral syndrome  Additional history obtained: Additional history obtained from EMS  and nursing home/care facility Records reviewed previous admission documents, Nursing Home Documents, and recent ED records  ED Course and Reassessment: On patient's arrival he was febrile and tachypneic but otherwise hemodynamically stable and satting well on room air, ill-appearing but in no acute distress.  Patient will undergo sepsis workup in addition to head CT and C-spine pelvis x-ray to evaluate for traumatic injury.  Patient will have stool studies ordered and should he have diarrhea here.  Patient will be closely reassessed.  Independent labs interpretation:  The following labs were independently interpreted: Elevated lactate, UA positive for UTI, leukocytosis  Independent visualization of imaging: - I independently visualized the following imaging with scope of interpretation limited to determining acute life threatening conditions related to emergency care: CT head/C-spine, chest x-ray, pelvis x-ray, left hip x-ray, which revealed no acute disease  Consultation: - Consulted or discussed management/test interpretation w/ external professional: Internal medicine  Consideration for admission or further workup: Patient requires admission for sepsis Social Determinants of health: N/A    Amount and/or Complexity of Data Reviewed Labs: ordered. Radiology: ordered.  Risk OTC drugs. Prescription drug management. Decision regarding hospitalization.          Final Clinical Impression(s) / ED Diagnoses Final diagnoses:  Sepsis without acute organ dysfunction, due to unspecified organism [facility])  Acute cystitis without hematuria  Fall, initial encounter  COVID-19  Diarrhea, unspecified type    Rx / DC Orders ED Discharge Orders     None          Rexford Maus, DO 08/20/23 1439

## 2023-08-20 NOTE — Hospital Course (Addendum)
 Temp 103.35F  UA +nitrites and leukocytes Hip xray negative  Given cefepime LR 2L   Leukocytosis Elevated lactate Wbc 14.1  -gi panel -urine culture  Abilify  Synthroid  Just came off Paxlovid

## 2023-08-21 DIAGNOSIS — Z79890 Hormone replacement therapy (postmenopausal): Secondary | ICD-10-CM | POA: Diagnosis not present

## 2023-08-21 DIAGNOSIS — R197 Diarrhea, unspecified: Secondary | ICD-10-CM | POA: Diagnosis not present

## 2023-08-21 DIAGNOSIS — A0811 Acute gastroenteropathy due to Norwalk agent: Secondary | ICD-10-CM | POA: Insufficient documentation

## 2023-08-21 DIAGNOSIS — Z7982 Long term (current) use of aspirin: Secondary | ICD-10-CM | POA: Diagnosis not present

## 2023-08-21 DIAGNOSIS — L89102 Pressure ulcer of unspecified part of back, stage 2: Secondary | ICD-10-CM | POA: Diagnosis present

## 2023-08-21 DIAGNOSIS — Z79899 Other long term (current) drug therapy: Secondary | ICD-10-CM | POA: Diagnosis not present

## 2023-08-21 DIAGNOSIS — B964 Proteus (mirabilis) (morganii) as the cause of diseases classified elsewhere: Secondary | ICD-10-CM | POA: Diagnosis present

## 2023-08-21 DIAGNOSIS — Z8673 Personal history of transient ischemic attack (TIA), and cerebral infarction without residual deficits: Secondary | ICD-10-CM | POA: Diagnosis not present

## 2023-08-21 DIAGNOSIS — Z800 Family history of malignant neoplasm of digestive organs: Secondary | ICD-10-CM | POA: Diagnosis not present

## 2023-08-21 DIAGNOSIS — Z91011 Allergy to milk products: Secondary | ICD-10-CM | POA: Diagnosis not present

## 2023-08-21 DIAGNOSIS — Z880 Allergy status to penicillin: Secondary | ICD-10-CM | POA: Diagnosis not present

## 2023-08-21 DIAGNOSIS — Z91018 Allergy to other foods: Secondary | ICD-10-CM | POA: Diagnosis not present

## 2023-08-21 DIAGNOSIS — N3090 Cystitis, unspecified without hematuria: Secondary | ICD-10-CM | POA: Diagnosis not present

## 2023-08-21 DIAGNOSIS — N390 Urinary tract infection, site not specified: Secondary | ICD-10-CM | POA: Insufficient documentation

## 2023-08-21 DIAGNOSIS — Z66 Do not resuscitate: Secondary | ICD-10-CM | POA: Diagnosis present

## 2023-08-21 DIAGNOSIS — E039 Hypothyroidism, unspecified: Secondary | ICD-10-CM | POA: Diagnosis present

## 2023-08-21 DIAGNOSIS — F01A0 Vascular dementia, mild, without behavioral disturbance, psychotic disturbance, mood disturbance, and anxiety: Secondary | ICD-10-CM | POA: Diagnosis present

## 2023-08-21 DIAGNOSIS — E860 Dehydration: Secondary | ICD-10-CM | POA: Diagnosis present

## 2023-08-21 DIAGNOSIS — G9341 Metabolic encephalopathy: Secondary | ICD-10-CM | POA: Diagnosis present

## 2023-08-21 DIAGNOSIS — Z883 Allergy status to other anti-infective agents status: Secondary | ICD-10-CM | POA: Diagnosis not present

## 2023-08-21 DIAGNOSIS — Z8616 Personal history of COVID-19: Secondary | ICD-10-CM | POA: Diagnosis not present

## 2023-08-21 DIAGNOSIS — L8990 Pressure ulcer of unspecified site, unspecified stage: Secondary | ICD-10-CM | POA: Insufficient documentation

## 2023-08-21 DIAGNOSIS — N3000 Acute cystitis without hematuria: Secondary | ICD-10-CM | POA: Diagnosis present

## 2023-08-21 DIAGNOSIS — Z881 Allergy status to other antibiotic agents status: Secondary | ICD-10-CM | POA: Diagnosis not present

## 2023-08-21 DIAGNOSIS — E876 Hypokalemia: Secondary | ICD-10-CM | POA: Diagnosis present

## 2023-08-21 DIAGNOSIS — E872 Acidosis, unspecified: Secondary | ICD-10-CM | POA: Diagnosis present

## 2023-08-21 DIAGNOSIS — A4189 Other specified sepsis: Secondary | ICD-10-CM | POA: Diagnosis present

## 2023-08-21 DIAGNOSIS — L89891 Pressure ulcer of other site, stage 1: Secondary | ICD-10-CM | POA: Diagnosis present

## 2023-08-21 DIAGNOSIS — N3001 Acute cystitis with hematuria: Secondary | ICD-10-CM | POA: Diagnosis present

## 2023-08-21 LAB — GASTROINTESTINAL PANEL BY PCR, STOOL (REPLACES STOOL CULTURE)

## 2023-08-21 LAB — CBC
HCT: 34.6 % — ABNORMAL LOW (ref 39.0–52.0)
Hemoglobin: 11.8 g/dL — ABNORMAL LOW (ref 13.0–17.0)
MCH: 31.1 pg (ref 26.0–34.0)
MCHC: 34.1 g/dL (ref 30.0–36.0)
MCV: 91.1 fL (ref 80.0–100.0)
Platelets: 181 10*3/uL (ref 150–400)
RBC: 3.8 MIL/uL — ABNORMAL LOW (ref 4.22–5.81)
RDW: 13.7 % (ref 11.5–15.5)
WBC: 21.9 10*3/uL — ABNORMAL HIGH (ref 4.0–10.5)
nRBC: 0 % (ref 0.0–0.2)

## 2023-08-21 LAB — BASIC METABOLIC PANEL
Anion gap: 7 (ref 5–15)
BUN: 16 mg/dL (ref 8–23)
CO2: 24 mmol/L (ref 22–32)
Calcium: 7.8 mg/dL — ABNORMAL LOW (ref 8.9–10.3)
Chloride: 103 mmol/L (ref 98–111)
Creatinine, Ser: 0.74 mg/dL (ref 0.61–1.24)
GFR, Estimated: >60 mL/min (ref >60)
Glucose, Bld: 150 mg/dL — ABNORMAL HIGH (ref 70–99)
Potassium: 3.2 mmol/L — ABNORMAL LOW (ref 3.5–5.1)
Sodium: 134 mmol/L — ABNORMAL LOW (ref 135–145)

## 2023-08-21 LAB — GLUCOSE, CAPILLARY: Glucose-Capillary: 189 mg/dL — ABNORMAL HIGH (ref 70–99)

## 2023-08-21 LAB — LACTIC ACID, PLASMA
Lactic Acid, Venous: 1 mmol/L (ref 0.5–1.9)
Lactic Acid, Venous: 1.3 mmol/L (ref 0.5–1.9)

## 2023-08-21 MED ORDER — ACETAMINOPHEN 325 MG PO TABS
650.0000 mg | ORAL_TABLET | Freq: Four times a day (QID) | ORAL | Status: DC | PRN
  Administered 2023-08-21: 650 mg via ORAL
  Filled 2023-08-21: qty 2

## 2023-08-21 MED ORDER — SODIUM CHLORIDE 0.9 % IV BOLUS
1000.0000 mL | Freq: Once | INTRAVENOUS | Status: AC
  Administered 2023-08-21: 1000 mL via INTRAVENOUS

## 2023-08-21 MED ORDER — POTASSIUM CHLORIDE 10 MEQ/100ML IV SOLN
10.0000 meq | INTRAVENOUS | Status: AC
  Administered 2023-08-21 (×4): 10 meq via INTRAVENOUS
  Filled 2023-08-21 (×4): qty 100

## 2023-08-21 NOTE — Consult Note (Signed)
 Urology Consult Note   Requesting Attending Physician:  Mercie Eon, MD Service Providing Consult: Urology  Consulting Attending: Dr. Irine Seal   Reason for Consult: Hematuria  HPI: Paul Hopkins is seen in consultation for reasons noted above at the request of Mercie Eon, MD. presents from his facility[facility] with acute AMS and 1 week of diarrhea.  Apparently he was also COVID-positive.  As of this morning patient was experiencing frank hematuria, reported to be a new finding.  He does not have a hx with out practice. On my arrival patient was barely responsive, though per nursing this was largely due to his difficulty hearing   ------------------  Assessment:  89 y.o. male with new onset hematuria in the context of Xarelto.    Recommendations: #hematuria Hold anticoagulation if possible. Would recommend short acting anticoagulants in the acute setting, particularly elderly men.   Bleeding is frank, but with minimal clots. on my bladder scan. Trend H&H. 13.9-->11.8. Hopefully he will remain stable without clot obstruction of urine during washout.   CT hematuria and cystoscopy can be performed on an outpt basis.   If he fails conservative measures, can consider hematuria catheter placement and CBI. Cystoscopy with fulguration would be a final step.   Case and plan discussed with Dr. Lafonda Mosses  Past Medical History: Past Medical History:  Diagnosis Date   Bradycardia    Delusional disorder (HCC)    Dementia (HCC)    Hypothyroidism    Hypothyroidism    Meniere disease    Non-pressure chronic ulcer of buttock (HCC)    Osteoarthritis    Stroke (HCC)    Vascular dementia (HCC)     Past Surgical History:  Past Surgical History:  Procedure Laterality Date   EXCISIONAL HEMORRHOIDECTOMY      Medication: Current Facility-Administered Medications  Medication Dose Route Frequency Provider Last Rate Last Admin   acetaminophen (TYLENOL) tablet 650 mg   650 mg Oral Q6H PRN Atway, Rayann N, DO   650 mg at 08/21/23 0606   ARIPiprazole (ABILIFY) tablet 10 mg  10 mg Oral Daily Masters, Katie, DO   10 mg at 08/21/23 0951   cefTRIAXone (ROCEPHIN) 1 g in sodium chloride 0.9 % 100 mL IVPB  1 g Intravenous Q24H Masters, Katie, DO 200 mL/hr at 08/20/23 2357 1 g at 08/20/23 2357   levothyroxine (SYNTHROID) tablet 88 mcg  88 mcg Oral QAC breakfast Masters, Katie, DO   88 mcg at 08/21/23 1610    Allergies: Allergies  Allergen Reactions   Amoxicillin Other (See Comments)    Reaction??   Banana Other (See Comments)    Hip pain   Bean Pod Extract Diarrhea and Other (See Comments)    Explosive diarrhea   Blackberry [Rubus Fruticosus] Other (See Comments)    Flares the patient's Mnire's disease   Broccoli [Brassica Oleracea]    Carrot Oil Diarrhea and Other (See Comments)    Explosive diarrhea   Ciprofloxacin Other (See Comments)    Reaction??   Citric Acid Diarrhea and Other (See Comments)    Explosive diarrhea   Citrus Diarrhea and Other (See Comments)    Explosive diarrhea   Corn-Containing Products Other (See Comments)    Explosive diarrhea   Egg [Egg-Derived Products] Diarrhea and Other (See Comments)    Explosive diarrhea   Milk-Related Compounds Diarrhea and Other (See Comments)    Explosive diarrhea   Other Diarrhea and Other (See Comments)    NO BERRIES WITH TINY SEEDS: Flares  the patient's Mnire's disease  Bread- Explosive diarrhea   Peanut-Containing Drug Products Diarrhea and Other (See Comments)    NO LEGUMES!! NO PEAS!! Explosive diarrhea   Raspberry Other (See Comments)    Flares the patient's Mnire's disease   Salmon [Fish Oil]    Salmon-Oats-Squash [Alitraq]    Strawberry Extract Other (See Comments)    Flares the patient's Mnire's disease   Sulfa Antibiotics Other (See Comments)    Reaction??   Trimethoprim Other (See Comments)    Reaction??   Whey Diarrhea and Other (See Comments)    Explosive diarrhea     Social History: Social History   Tobacco Use   Smoking status: Never   Smokeless tobacco: Never  Vaping Use   Vaping status: Never Used  Substance Use Topics   Alcohol use: Never   Drug use: Never    Family History Family History  Problem Relation Age of Onset   Colon cancer Father     Review of Systems  Genitourinary:  Positive for hematuria.     Objective   Vital signs in last 24 hours: BP (!) 100/50 (BP Location: Right Arm)   Pulse 66   Temp 98.3 F (36.8 C) (Oral)   Resp 17   Ht 6' (1.829 m)   Wt 70 kg   SpO2 93%   BMI 20.93 kg/m   Physical Exam General:Alert, resting, AMS HEENT: [place]/AT, extremely hard of hearing Pulmonary: Normal work of breathing Cardiovascular: no cyanosis Abdomen: Soft, NTTP, nondistended GU: Condom catheter in place with cherry colored urine.  A few small stringy clots noted.  Nonobstructive.   Most Recent Labs: Lab Results  Component Value Date   WBC 21.9 (H) 08/21/2023   HGB 11.8 (L) 08/21/2023   HCT 34.6 (L) 08/21/2023   PLT 181 08/21/2023    Lab Results  Component Value Date   NA 134 (L) 08/21/2023   K 3.2 (L) 08/21/2023   CL 103 08/21/2023   CO2 24 08/21/2023   BUN 16 08/21/2023   CREATININE 0.74 08/21/2023   CALCIUM 7.8 (L) 08/21/2023   MG 2.0 04/01/2023   PHOS 3.0 04/01/2023    Lab Results  Component Value Date   INR 1.2 08/20/2023   APTT 30 08/20/2023     Urine Culture: @[id number] (laburin,org,[id number])@   IMAGING: CT Cervical Spine Wo Contrast Result Date: 08/20/2023 CLINICAL DATA:  89 year old male with altered mental status. Diarrhea, fall. Fever 101 F EXAM: CT CERVICAL SPINE WITHOUT CONTRAST TECHNIQUE: Multidetector CT imaging of the cervical spine was performed without intravenous contrast. Multiplanar CT image reconstructions were also generated. RADIATION DOSE REDUCTION: This exam was performed according to the departmental dose-optimization program which includes automated exposure  control, adjustment of the mA and/or kV according to patient size and/or use of iterative reconstruction technique. COMPARISON:  Head CT today reported separately. CTA head and neck 02/13/2020. FINDINGS: Alignment: Stable and mildly exaggerated cervical lordosis. Cervicothoracic junction alignment is within normal limits. Bilateral posterior element alignment is within normal limits. Skull base and vertebrae: Stable bone mineralization since 2021. Visualized skull base is intact. No atlanto-occipital dissociation. C1 and C2 appear intact and aligned. No acute osseous abnormality identified. Soft tissues and spinal canal: No prevertebral fluid or swelling. No visible canal hematoma. Negative visible noncontrast neck soft tissues, small volume right IJ gas likely related to recent intravenous access. Disc levels: Bulky chronic cervical ligament flavum degeneration, hypertrophy which is sometimes calcified. Chronic disc and endplate degeneration. Subtle degenerative appearing anterolisthesis of C4 on C5.  This is not significantly changed since 2021 and underlying spinal canal fairly capacious. Mild if any associated spinal stenosis. Upper chest: Osteopenia. Visible upper thoracic levels appear intact. Negative lung apices. IMPRESSION: 1. No acute traumatic injury identified in the cervical spine. 2. Chronic cervical spine degeneration not significantly changed since 2021. Electronically Signed   By: Odessa Fleming M.D.   On: 08/20/2023 11:17   CT Head Wo Contrast Result Date: 08/20/2023 CLINICAL DATA:  89 year old male with altered mental status. Diarrhea, fall. Fever 101 F EXAM: CT HEAD WITHOUT CONTRAST TECHNIQUE: Contiguous axial images were obtained from the base of the skull through the vertex without intravenous contrast. RADIATION DOSE REDUCTION: This exam was performed according to the departmental dose-optimization program which includes automated exposure control, adjustment of the mA and/or kV according to patient  size and/or use of iterative reconstruction technique. COMPARISON:  Brain MRI 10/28/2021.  Head CT 10/27/2021. FINDINGS: Brain: Cerebral volume not significantly changed. No midline shift, ventriculomegaly, mass effect, evidence of mass lesion, intracranial hemorrhage or evidence of cortically based acute infarction. Patchy and confluent chronic bilateral white matter disease does not appear significantly changed from 2023, including involvement of the right external capsule. Stable gray-white differentiation. Vascular: Chronic severe intracranial artery dolichoectasia, especially the basilar artery. Extensive Calcified atherosclerosis at the skull base. No suspicious intracranial vascular hyperdensity. Skull: Intact.  No acute osseous abnormality identified. Sinuses/Orbits: Moderate new paranasal sinus mucosal thickening most pronounced in the ethmoids. Tympanic cavities and mastoids remain clear. Other: No orbit or scalp soft tissue injury identified. IMPRESSION: 1. No acute intracranial abnormality or acute traumatic injury identified. 2. 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 3. New moderate paranasal sinus inflammation. Electronically Signed   By: Odessa Fleming M.D.   On: 08/20/2023 11:13   DG Hip Unilat With Pelvis 2-3 Views Left Result Date: 08/20/2023 CLINICAL DATA:  Fall. EXAM: DG HIP (WITH OR WITHOUT PELVIS) 2-3V LEFT COMPARISON:  None Available. FINDINGS: There is no evidence of hip fracture or dislocation. There is no evidence of arthropathy or other focal bone abnormality. IMPRESSION: Negative. Electronically Signed   By: Kennith Center M.D.   On: 08/20/2023 10:58   DG Pelvis Portable Result Date: 08/20/2023 CLINICAL DATA:  fall EXAM: PORTABLE PELVIS 1-2 VIEWS COMPARISON:  July 11, 2023 FINDINGS: Osteopenia. There is linear sclerosis along the LEFT subtrochanteric femur. No definitive displaced fracture is  visualized. Degenerative changes of the lower lumbar spine. Sacrum is obscured by overlapping bowel contents. Vascular calcifications. IMPRESSION: There is linear sclerosis along the LEFT subtrochanteric femur. This is nonspecific and could a nondisplaced fracture versus summation artifact. Recommend dedicated hip radiographs for further evaluation. Electronically Signed   By: Meda Klinefelter M.D.   On: 08/20/2023 09:49   DG Chest Port 1 View Result Date: 08/20/2023 CLINICAL DATA:  Diarrhea, fall, questionable sepsis EXAM: PORTABLE CHEST 1 VIEW COMPARISON:  08/15/2023 chest radiograph. FINDINGS: Stable cardiomediastinal silhouette with normal heart size. No pneumothorax. No pleural effusion. Lungs appear clear, with no acute consolidative airspace disease and no pulmonary edema. IMPRESSION: No active disease. Electronically Signed   By: Delbert Phenix M.D.   On: 08/20/2023 09:46    ------  Elmon Kirschner, NP Pager: [phone]   Please contact the urology consult pager with any further questions/concerns.

## 2023-08-21 NOTE — Evaluation (Signed)
 Occupational Therapy Evaluation Patient Details Name: Paul Hopkins MRN: [id number] DOB: 08-Apr-1934 Today's Date: 08/21/2023   History of Present Illness   Pt is a 89 y.o. male admitted 08/20/23 from [name] for fall at night and altered mental status. Suspect UTI PMH: dementia, hypothyroidism, CVA, Mnire's disease, osteoarthritis.     Clinical Impressions Pt admitted based on above, and was seen based on problem list below. PTA pt was living at [facility] facility receiving assistance for ADLs and IADLs.  Today pt is requiring set up to total assist +2 for  ADLs. Bed mobility was total assist +2, functional transfers not attempted d/t decreased sitting balance. Recommendation of <3 hours of skilled rehab daily to optimize independence levels. OT will continue to follow acutely to maximize functional independence.      If plan is discharge home, recommend the following:   Two people to help with walking and/or transfers;Two people to help with bathing/dressing/bathroom;Direct supervision/assist for medications management;Direct supervision/assist for financial management     Functional Status Assessment   Patient has had a recent decline in their functional status and demonstrates the ability to make significant improvements in function in a reasonable and predictable amount of time.     Equipment Recommendations   Other (comment) (Defer to next venue)     Recommendations for Other Services         Precautions/Restrictions   Precautions Precautions: Fall Recall of Precautions/Restrictions: Impaired Restrictions Weight Bearing Restrictions Per Provider Order: No     Mobility Bed Mobility Overal bed mobility: Needs Assistance Bed Mobility: Rolling, Supine to Sit Rolling: Total assist, Used rails, +2 for physical assistance   Supine to sit: Max assist, HOB elevated, Used rails     General bed mobility comments: Pt rolls better  towards his left (min assist) rolling towards right max    Transfers                   General transfer comment: Not safe to attempt at this time d/t decreased sitting balance      Balance Overall balance assessment: Needs assistance, History of Falls Sitting-balance support: Bilateral upper extremity supported, Feet supported Sitting balance-Leahy Scale: Poor   Postural control: Posterior lean                                 ADL either performed or assessed with clinical judgement   ADL Overall ADL's : Needs assistance/impaired Eating/Feeding: Set up;Bed level   Grooming: Set up;Bed level           Upper Body Dressing : Minimal assistance;Bed level Upper Body Dressing Details (indicate cue type and reason): With increased time Lower Body Dressing: Total assistance;Bed level       Toileting- Clothing Manipulation and Hygiene: +2 for physical assistance;Total assistance;Bed level Toileting - Clothing Manipulation Details (indicate cue type and reason): Total for hygiene             Vision Baseline Vision/History: 1 Wears glasses Vision Assessment?: No apparent visual deficits     Perception         Praxis         Pertinent Vitals/Pain Pain Assessment Pain Assessment: Faces Faces Pain Scale: Hurts a little bit     Extremity/Trunk Assessment Upper Extremity Assessment Upper Extremity Assessment: Generalized weakness;Right hand dominant   Lower Extremity Assessment Lower Extremity Assessment: Defer to PT evaluation   Cervical /  Trunk Assessment Cervical / Trunk Assessment: Normal   Communication Communication Communication: Impaired Factors Affecting Communication: Hearing impaired   Cognition Arousal: Alert Behavior During Therapy: Flat affect Cognition: History of cognitive impairments             OT - Cognition Comments: Per daughter cog fairly intact, hearing impairment limits pt                 Following  commands: Impaired Following commands impaired: Follows one step commands with increased time     Cueing  General Comments   Cueing Techniques: Verbal cues;Tactile cues;Visual cues  Per daughter pt's hearing impairment may impact ability to follow commands.   Exercises     Shoulder Instructions      Home Living Family/patient expects to be discharged to:: Assisted living                             Home Equipment: Rolling Walker (2 wheels);BSC/3in1   Additional Comments: Pt from IND living at [facility]      Prior Functioning/Environment Prior Level of Function : Needs assist             Mobility Comments: Per daughter Rw at baseline, but uses a wc for long distances d/t recent hip pain ADLs Comments: Per daughter pt Receiving assistance to dress, but able to complete toileting, feed himself, and bathe himself    OT Problem List: Decreased strength;Decreased range of motion;Decreased activity tolerance;Impaired balance (sitting and/or standing);Decreased cognition;Decreased safety awareness   OT Treatment/Interventions: Self-care/ADL training;Therapeutic exercise;Energy conservation;DME and/or AE instruction;Therapeutic activities;Patient/family education;Balance training      OT Goals(Current goals can be found in the care plan section)   Acute Rehab OT Goals OT Goal Formulation: With patient Time For Goal Achievement: 09/04/23 Potential to Achieve Goals: Good   OT Frequency:  Min 2X/week    Co-evaluation              AM-PAC OT "6 Clicks" Daily Activity     Outcome Measure Help from another person eating meals?: A Little Help from another person taking care of personal grooming?: A Little Help from another person toileting, which includes using toliet, bedpan, or urinal?: Total Help from another person bathing (including washing, rinsing, drying)?: A Lot Help from another person to put on and taking off regular upper body clothing?: A  Little Help from another person to put on and taking off regular lower body clothing?: Total 6 Click Score: 13   End of Session Nurse Communication: Mobility status  Activity Tolerance: Patient tolerated treatment well Patient left: in bed;with bed alarm set  OT Visit Diagnosis: Unsteadiness on feet (R26.81);Other abnormalities of gait and mobility (R26.89);Muscle weakness (generalized) (M62.81)                Time: 1005-1036 OT Time Calculation (min): 31 min Charges:  OT General Charges $OT Visit: 1 Visit OT Evaluation $OT Eval Moderate Complexity: 1 Mod OT Treatments $Self Care/Home Management : 8-22 mins  Ivor Messier, OT  Acute Rehabilitation Services Office [phone] Secure chat preferred   Marilynne Drivers 08/21/2023, 11:12 AM

## 2023-08-21 NOTE — Plan of Care (Signed)
  Problem: Clinical Measurements: Goal: Will remain free from infection Outcome: Progressing   Problem: Nutrition: Goal: Adequate nutrition will be maintained Outcome: Progressing   Problem: Elimination: Goal: Will not experience complications related to bowel motility Outcome: Progressing   Problem: Pain Managment: Goal: General experience of comfort will improve and/or be controlled Outcome: Progressing   Problem: Safety: Goal: Ability to remain free from injury will improve Outcome: Progressing

## 2023-08-21 NOTE — Progress Notes (Signed)
 HD#0 SUBJECTIVE:  Patient Summary: Paul Hopkins is a 89 y.o. with a pertinent PMH of vascular dementia, who presented with a fall in the setting of weakness and increased confusion and admitted for acute metabolic encephalopathy.   Overnight Events: GI panel grew norovirus  Interim History:  Patient laying in bed, in no acute distress . Didn't have any complaints overnight. Endorses feeling better overall compared to when he first came in.He asked that we updated her daughter on his care plan   OBJECTIVE:  Vital Signs: Vitals:   08/20/23 2027 08/20/23 2334 08/21/23 0411 08/21/23 0735  BP: (!) 131/53 (!) 114/51 (!) 107/52 (!) 100/50  Pulse: 67 68 69 66  Resp: 18 16 20 17   Temp: 98.9 F (37.2 C) 98.7 F (37.1 C) 98 F (36.7 C) 98.3 F (36.8 C)  TempSrc:  Oral  Oral  SpO2: 95% 96% 93% 93%  Weight:      Height:       Supplemental O2: Room Air SpO2: 93 %  Filed Weights   08/20/23 0852  Weight: 70 kg     Intake/Output Summary (Last 24 hours) at 08/21/2023 1413 Last data filed at 08/21/2023 1300 Gross per 24 hour  Intake 2132.19 ml  Output --  Net 2132.19 ml   Net IO Since Admission: 2,182.19 mL [08/21/23 1413]  Physical Exam: Physical Exam Constitutional:      Appearance: He is ill-appearing.     Comments: NAD  HENT:     Head: Normocephalic and atraumatic.  Cardiovascular:     Rate and Rhythm: Normal rate and regular rhythm.  Pulmonary:     Effort: Pulmonary effort is normal.     Breath sounds: No wheezing.  Genitourinary:    Comments: Condom Cath in place with gross hematuria  Skin:    General: Skin is warm and dry.  Neurological:     Mental Status: He is alert.     Patient Lines/Drains/Airways Status     Active Line/Drains/Airways     Name Placement date Placement time Site Days   Peripheral IV 08/20/23 18 G Anterior;Distal;Left;Upper Arm 08/20/23  0848  Arm  1   Peripheral IV 08/20/23 20 G 1" Anterior;Right Forearm 08/20/23  0912  Forearm  1    Peripheral IV 08/20/23 18 G 1.16" Posterior;Right Forearm 08/20/23  0913  Forearm  1   Pressure Injury 08/20/23 Ischial tuberosity Bilateral Stage 1 -  Intact skin with non-blanchable redness of a localized area usually over a bony prominence. 08/20/23  1945  -- 1             ASSESSMENT/PLAN:  Assessment: Principal Problem:   Acute metabolic encephalopathy Active Problems:   Norovirus   Pressure injury of skin   Urinary tract infection   Plan:  #Gross Hematuria with clots This Patient initially came in after a fall and confusion ,found to have UTI for which he is being treated with Ceftriaxone. He was unfortunately found to have hematuria with blood clots in his  condom cath.Will need collateral from family to know if this is new or not. I am wondering why he has this new hematuria with blood clots in the setting of no instrumentation whatsoever during this hospitalization. He is on Xarelto for DVT prophylaxis. He denies any suprapubic pain. I think this patient will benefit from urology consult. Urology is paged and plans on letting  Dr Lafonda Mosses ( Urologist ) come assess the patient.Nurse will arrange for a  urology cart at bedside  prior to urology consult - F/U on Urology recs  - Urology rec appreciated   # Acute metabolic encephalopathy # UTI Likely in the setting of dehydration from diarrhea .WBC trended up to 21.9 < 14.1 however afebrile at 98.3.Patient mental status is improved compared to during admission.He is responding appropriately to questions, knew his date of birth, his full name and also knew he was in the hospital but could not say the name of the hospital. I think this patient mental status is improving overall.Urine culture grew gram negative rods. Will continue ceftriaxone, and follow-up blood cultures and trend WBC.  # Norovirus Gastrointestinal panel by PCR , Stool grew Norovirus which I suspect is likely the cause of his profuse diarrhea.  Abdomen is nontender  on exam and he does not endorse any abdominal pain.  Will treat supportively at this time.  # Hypokalemia Likely in the setting of diarrhea.  Will replete potassium and repeat BMP.  # Hypothyroidism Continue home Synthroid 88 mcg  #Vascular dementia -Continue Abilify 10 mg daily  Best Practice: Diet: Regular diet IVF: Fluids: 0.9NS, Rate:  Bolus VTE:  Code: DNR Limited AB: IV ceftriaxone Family Contact: Preferred to be called Robynn Pane , called and notified. DISPO: Anticipated discharge tomorrow to Home pending IV antibiotics and pending Culture  .  Signature: Kathleen Lime , MD Internal Medicine Resident, PGY-1 [facility]  Pager: [phone] 2:13 PM, 08/21/2023   Please contact the on call pager after 5 pm and on weekends at [phone].

## 2023-08-21 NOTE — Evaluation (Signed)
 Physical Therapy Evaluation Patient Details Name: Paul Hopkins MRN: [id number] DOB: December 12, 1933 Today's Date: 08/21/2023  History of Present Illness  Pt is a 89 y.o. male admitted 08/20/23 from [facility] for fall at night and altered mental status. Suspect UTI PMH: dementia, hypothyroidism, CVA, Mnire's disease, osteoarthritis.  Clinical Impression  Pt admitted with above diagnosis. Lives at [facility]; Prior to admission, pt was able to stand and walk short distances with RW; he indicated that since getting Covid he has been unable to walk, his caregivers push him to the dining hall in a wheelchair; Presents to PT with generalized weakness, decr balance with incr fall risk, a functional decline compared to baseline;  Needed +2 assist to come to sitting EOB, to stand, and to stand pivot transfer bed to recliner; Patient will benefit from continued inpatient follow up therapy, <3 hours/day; Pt currently with functional limitations due to the deficits listed below (see PT Problem List). Pt will benefit from skilled PT to increase their independence and safety with mobility to allow discharge to the venue listed below.       Worth considering that if he progresses well, and possible that family can ramp up services, he may be able to get back to [facility] with 24 hour assist       If plan is discharge home, recommend the following: A lot of help with walking and/or transfers;A lot of help with bathing/dressing/bathroom   Can travel by private vehicle   No    Equipment Recommendations Rolling walker (2 wheels);BSC/3in1  Recommendations for Other Services       Functional Status Assessment Patient has had a recent decline in their functional status and demonstrates the ability to make significant improvements in function in a reasonable and predictable amount of time.     Precautions / Restrictions Precautions Precautions: Fall Recall of Precautions/Restrictions:  Impaired Restrictions Weight Bearing Restrictions Per Provider Order: No      Mobility  Bed Mobility Overal bed mobility: Needs Assistance Bed Mobility: Supine to Sit Rolling: Used rails, +2 for physical assistance, Mod assist   Supine to sit: Max assist, HOB elevated, Used rails     General bed mobility comments: used helicopter method for getting up, with encouragement for pt to keep weight forward once EOB    Transfers Overall transfer level: Needs assistance Equipment used: 2 person hand held assist Transfers: Sit to/from Stand, Bed to chair/wheelchair/BSC Sit to Stand: +2 safety/equipment, +2 physical assistance, Mod assist Stand pivot transfers: +2 safety/equipment, +2 physical assistance, Mod assist         General transfer comment: Mod assist to power up and steady as pt movied into standing and turned to sit in recliner    Ambulation/Gait                  Stairs            Wheelchair Mobility     Tilt Bed    Modified Rankin (Stroke Patients Only)       Balance Overall balance assessment: Needs assistance, History of Falls Sitting-balance support: Bilateral upper extremity supported, Feet supported Sitting balance-Leahy Scale: Poor       Standing balance-Leahy Scale: Poor                               Pertinent Vitals/Pain Pain Assessment Pain Assessment: Faces Faces Pain Scale: Hurts a little bit    Home Living  Family/patient expects to be discharged to:: Assisted living                 Home Equipment: Agricultural consultant (2 wheels);BSC/3in1 Additional Comments: Pt from IND living at [facility]    Prior Function Prior Level of Function : Needs assist             Mobility Comments: Per daughter Rw at baseline, but uses a wc for long distances d/t recent hip pain ADLs Comments: Per daughter pt Receiving assistance to dress, but able to complete toileting, feed himself, and bathe himself      Extremity/Trunk Assessment   Upper Extremity Assessment Upper Extremity Assessment: Defer to OT evaluation    Lower Extremity Assessment Lower Extremity Assessment: Generalized weakness    Cervical / Trunk Assessment Cervical / Trunk Assessment: Normal  Communication   Communication Communication: Impaired Factors Affecting Communication: Hearing impaired    Cognition Arousal: Alert Behavior During Therapy: Flat affect                             Following commands: Impaired Following commands impaired: Follows one step commands with increased time     Cueing Cueing Techniques: Verbal cues, Tactile cues, Visual cues     General Comments General comments (skin integrity, edema, etc.): Recommend stedy for back to bed    Exercises     Assessment/Plan    PT Assessment Patient needs continued PT services  PT Problem List Decreased strength;Decreased activity tolerance;Decreased balance;Decreased mobility;Decreased coordination;Decreased knowledge of use of DME;Decreased safety awareness;Decreased knowledge of precautions       PT Treatment Interventions DME instruction;Gait training;Stair training;Functional mobility training;Therapeutic activities;Therapeutic exercise;Balance training;Neuromuscular re-education;Cognitive remediation;Patient/family education    PT Goals (Current goals can be found in the Care Plan section)  Acute Rehab PT Goals Patient Stated Goal: agreeing to get OOB PT Goal Formulation: Patient unable to participate in goal setting Time For Goal Achievement: 09/04/23 Potential to Achieve Goals: Good    Frequency Min 2X/week     Co-evaluation               AM-PAC PT "6 Clicks" Mobility  Outcome Measure Help needed turning from your back to your side while in a flat bed without using bedrails?: A Lot Help needed moving from lying on your back to sitting on the side of a flat bed without using bedrails?: A Lot Help needed  moving to and from a bed to a chair (including a wheelchair)?: Total Help needed standing up from a chair using your arms (e.g., wheelchair or bedside chair)?: Total Help needed to walk in hospital room?: Total Help needed climbing 3-5 steps with a railing? : Total 6 Click Score: 8    End of Session Equipment Utilized During Treatment: Gait belt Activity Tolerance: Patient tolerated treatment well Patient left: in chair;with call bell/phone within reach;with chair alarm set Nurse Communication: Mobility status (discussed using the stedy for back to bed with NT) PT Visit Diagnosis: Unsteadiness on feet (R26.81);Other abnormalities of gait and mobility (R26.89);Muscle weakness (generalized) (M62.81);Difficulty in walking, not elsewhere classified (R26.2)    Time: 1610-9604 PT Time Calculation (min) (ACUTE ONLY): 21 min   Charges:   PT Evaluation $PT Eval Moderate Complexity: 1 Mod   PT General Charges $$ ACUTE PT VISIT: 1 Visit         Van Clines, PT  Acute Rehabilitation Services Office [phone] Secure Chat welcomed   Levi Aland  08/21/2023, 4:54 PM

## 2023-08-22 DIAGNOSIS — G9341 Metabolic encephalopathy: Secondary | ICD-10-CM | POA: Diagnosis not present

## 2023-08-22 DIAGNOSIS — R197 Diarrhea, unspecified: Secondary | ICD-10-CM | POA: Diagnosis not present

## 2023-08-22 LAB — URINE CULTURE: Culture: 30000 — AB

## 2023-08-22 LAB — BASIC METABOLIC PANEL
Anion gap: 7 (ref 5–15)
BUN: 13 mg/dL (ref 8–23)
CO2: 22 mmol/L (ref 22–32)
Calcium: 8 mg/dL — ABNORMAL LOW (ref 8.9–10.3)
Chloride: 103 mmol/L (ref 98–111)
Creatinine, Ser: 0.63 mg/dL (ref 0.61–1.24)
GFR, Estimated: >60 mL/min (ref >60)
Glucose, Bld: 114 mg/dL — ABNORMAL HIGH (ref 70–99)
Potassium: 3.6 mmol/L (ref 3.5–5.1)
Sodium: 132 mmol/L — ABNORMAL LOW (ref 135–145)

## 2023-08-22 LAB — CBC
HCT: 36.5 % — ABNORMAL LOW (ref 39.0–52.0)
Hemoglobin: 12.5 g/dL — ABNORMAL LOW (ref 13.0–17.0)
MCH: 30.7 pg (ref 26.0–34.0)
MCHC: 34.2 g/dL (ref 30.0–36.0)
MCV: 89.7 fL (ref 80.0–100.0)
Platelets: 205 10*3/uL (ref 150–400)
RBC: 4.07 MIL/uL — ABNORMAL LOW (ref 4.22–5.81)
RDW: 13.7 % (ref 11.5–15.5)
WBC: 18.3 10*3/uL — ABNORMAL HIGH (ref 4.0–10.5)
nRBC: 0 % (ref 0.0–0.2)

## 2023-08-22 MED ORDER — CEFADROXIL 500 MG PO CAPS
1000.0000 mg | ORAL_CAPSULE | Freq: Two times a day (BID) | ORAL | Status: DC
  Administered 2023-08-22 – 2023-08-23 (×3): 1000 mg via ORAL
  Filled 2023-08-22 (×4): qty 2

## 2023-08-22 NOTE — Progress Notes (Signed)
 HD#1 SUBJECTIVE:  Patient Summary: Paul Hopkins is a 89 y.o. with a pertinent PMH of vascular dementia, who presented with a fall in the setting of weakness and increased confusion and admitted for acute metabolic encephalopathy.   Overnight Events: Urology saw patient. UA also grew Proteus   Interim History:  Patient seen at bedside this morning. He was laying in bed in no acute distress. Looks much better compared to yesterday. He says he feels much better. He is more awake today. He doesn't have any concerns at this time. Denies any abdominal pain.    OBJECTIVE:  Vital Signs: Vitals:   08/21/23 1613 08/21/23 2046 08/22/23 0453 08/22/23 0820  BP: (!) 116/51 (!) 123/57 (!) 140/62 (!) 145/68  Pulse: (!) 59 70 62 72  Resp: 17 18 18 17   Temp: 98.7 F (37.1 C) 98 F (36.7 C) 98.6 F (37 C) 98.3 F (36.8 C)  TempSrc: Oral Oral Oral Oral  SpO2: 97% 93% 97% 94%  Weight:      Height:       Supplemental O2: Room Air SpO2: 94 %  Filed Weights   08/20/23 0852  Weight: 70 kg     Intake/Output Summary (Last 24 hours) at 08/22/2023 1359 Last data filed at 08/22/2023 0960 Gross per 24 hour  Intake 1391.79 ml  Output 750 ml  Net 641.79 ml   Net IO Since Admission: 2,823.98 mL [08/22/23 1359]  Physical Exam: Physical Exam Constitutional:      Appearance: He is ill-appearing.     Comments: NAD  HENT:     Head: Normocephalic and atraumatic.  Cardiovascular:     Rate and Rhythm: Normal rate and regular rhythm.  Pulmonary:     Effort: Pulmonary effort is normal.     Breath sounds: No wheezing.  Genitourinary:    Comments: Condom Cath in place with amber looking urine  Skin:    General: Skin is warm and dry.  Neurological:     Mental Status: He is alert.     Patient Lines/Drains/Airways Status     Active Line/Drains/Airways     Name Placement date Placement time Site Days   Peripheral IV 08/20/23 18 G Anterior;Distal;Left;Upper Arm 08/20/23  0848  Arm  1    Peripheral IV 08/20/23 20 G 1" Anterior;Right Forearm 08/20/23  0912  Forearm  1   Peripheral IV 08/20/23 18 G 1.16" Posterior;Right Forearm 08/20/23  0913  Forearm  1   Pressure Injury 08/20/23 Ischial tuberosity Bilateral Stage 1 -  Intact skin with non-blanchable redness of a localized area usually over a bony prominence. 08/20/23  1945  -- 1             ASSESSMENT/PLAN:  Assessment: Principal Problem:   Acute metabolic encephalopathy Active Problems:   Norovirus   Pressure injury of skin   Urinary tract infection   Diarrhea   Plan: #Gross Hematuria with clots  Dr  Irine Seal saw patient and recommend conservative measures and if fails,consider hematuria catheter placement and CBI before proceeding to cystoscopy with fulguration. This new hematuria is likely due to the his cystitis. His urine bag is amber looking today and not bloody. Will ensure a urology follow up is set up prior to discharge. Urology recs greatly appreciated   # Acute metabolic encephalopathy  # UTI #Proteus mirabilis  Urine culture grew proteus mirabilis. Switching IV ceftriaxone to Cefadroxil 1 g PO  BID for 6 more doses. Patient doesn't endorse any suprapubic pain White  count now 18.3 << 21.9.Will continue to trend white count.   # Norovirus  - Diarrhea improved. Continue supportive care and contact precautions   # Hypokalemia  Resolved   # Hypothyroidism  Continue home Synthroid 88 mcg  #Vascular dementia  -Continue Abilify 10 mg daily  Best Practice: Diet: Regular diet IVF: Fluids: 0.9NS, Rate:  Bolus VTE:  Code: DNR Limited AB: IV ceftriaxone Family Contact: Preferred to be called Robynn Pane , called and notified. DISPO: Anticipated discharge tomorrow to Home pending IV antibiotics and pending Culture  .  Signature: Kathleen Lime , MD Internal Medicine Resident, PGY-1 [facility]  Pager: [phone] 1:59 PM, 08/22/2023   Please contact the on call pager  after 5 pm and on weekends at [phone].

## 2023-08-22 NOTE — TOC Initial Note (Signed)
 Transition of Care [facility]) - Initial/Assessment Note    Patient Details  Name: Paul Hopkins MRN: [id number] Date of Birth: 1933-07-16  Transition of Care [facility]) CM/SW Contact:    Carley Hammed, LCSW Phone Number: 08/22/2023, 11:35 AM  Clinical Narrative:                 CSW noting pt is disoriented and spoke with dtr. Dtr confirms pt is from [facility]. CSW explained the recommendation for SNF at DC. Per dtr, pt had been to [facility] before and did not do well. She notes pt does best in his own environment and would prefer for him to return to [facility] with HH through [employer]. CSW spoke with [facility] and faxed over clinicals for them to review. Fax # [phone]. TOC will continue to follow.   Expected Discharge Plan: Assisted Living Barriers to Discharge: Continued Medical Work up, Other (must enter comment) (Back to ALF when approved.)   Patient Goals and CMS Choice Patient states their goals for this hospitalization and ongoing recovery are:: Pt disoriented and unable to participate in goal setting. CMS Medicare.gov Compare Post Acute Care list provided to:: Patient Represenative (must comment) Choice offered to / list presented to : Adult Children      Expected Discharge Plan and Services In-house Referral: Clinical Social Work   Post Acute Care Choice: Resumption of Svcs/PTA Provider, Home Health (ALF) Living arrangements for the past 2 months: Assisted Living Facility                                      Prior Living Arrangements/Services Living arrangements for the past 2 months: Assisted Living Facility Lives with:: Facility Resident   Do you feel safe going back to the place where you live?: Yes      Need for Family Participation in Patient Care: Yes (Comment) Care giver support system in place?: Yes (comment)   Criminal Activity/Legal Involvement Pertinent to Current Situation/Hospitalization: No - Comment as needed  Activities of Daily  Living   ADL Screening (condition at time of admission) Independently performs ADLs?: Yes (appropriate for developmental age) Is the patient deaf or have difficulty hearing?: Yes Does the patient have difficulty seeing, even when wearing glasses/contacts?: No Does the patient have difficulty concentrating, remembering, or making decisions?: Yes  Permission Sought/Granted Permission sought to share information with : Family Supports, Oceanographer granted to share information with : Yes, Verbal Permission Granted  Share Information with NAME: Robynn Pane  Permission granted to share info w AGENCY: [facility]  Permission granted to share info w Relationship: Dtr     Emotional Assessment Appearance:: Appears stated age Attitude/Demeanor/Rapport: Unable to Assess Affect (typically observed): Unable to Assess Orientation: : Oriented to Self, Oriented to Place Alcohol / Substance Use: Not Applicable Psych Involvement: No (comment)  Admission diagnosis:  Acute cystitis without hematuria [N30.00] Fall, initial encounter [W19.XXXA] Acute metabolic encephalopathy [G93.41] Diarrhea, unspecified type [R19.7] Sepsis without acute organ dysfunction, due to unspecified organism (HCC) [A41.9] COVID-19 [U07.1] Patient Active Problem List   Diagnosis Date Noted   Norovirus 08/21/2023   Pressure injury of skin 08/21/2023   Urinary tract infection 08/21/2023   Diarrhea 08/21/2023   Acute metabolic encephalopathy 08/20/2023   Lumbar stress fracture 04/03/2023   Lumbar compression fracture, closed, initial encounter (HCC) 04/01/2023   Hypoproteinemia (HCC) 04/01/2023   Sinus bradycardia 05/19/2021   Pseudomonas infection  11/28/2019   Vascular dementia (HCC) 11/28/2019   Hypothyroidism (acquired) 11/28/2019   Palliative care by specialist    Goals of care, counseling/discussion    DNR (do not resuscitate)    Sepsis (HCC) 11/27/2019   PCP:  Eloisa Northern, MD Pharmacy:    [facility] [place], [place] - [phone] [place] [address] Phone: [phone] Fax: [phone]  [facility] [address] [place] [address] Phone: [phone] Fax: [phone]     Social Drivers of Health (SDOH) Social History: SDOH Screenings   Food Insecurity: Patient Declined (08/20/2023)  Housing: Unknown (08/20/2023)  Transportation Needs: Patient Declined (08/20/2023)  Utilities: Patient Declined (08/20/2023)  Social Connections: Unknown (08/20/2023)  Tobacco Use: Low Risk  (08/20/2023)   SDOH Interventions:     Readmission Risk Interventions     No data to display

## 2023-08-22 NOTE — Plan of Care (Signed)
  Problem: Coping: Goal: Psychosocial and spiritual needs will be supported Outcome: Progressing   Problem: Respiratory: Goal: Will maintain a patent airway Outcome: Progressing   Problem: Education: Goal: Knowledge of risk factors and measures for prevention of condition will improve Outcome: Not Progressing   Problem: Education: Goal: Knowledge of General Education information will improve Description: Including pain rating scale, medication(s)/side effects and non-pharmacologic comfort measures Outcome: Not Progressing   Problem: Health Behavior/Discharge Planning: Goal: Ability to manage health-related needs will improve Outcome: Not Progressing

## 2023-08-22 NOTE — Plan of Care (Signed)
  Problem: Clinical Measurements: Goal: Will remain free from infection Outcome: Progressing Goal: Diagnostic test results will improve Outcome: Progressing   Problem: Activity: Goal: Risk for activity intolerance will decrease Outcome: Progressing   Problem: Nutrition: Goal: Adequate nutrition will be maintained Outcome: Progressing   Problem: Elimination: Goal: Will not experience complications related to bowel motility Outcome: Progressing Goal: Will not experience complications related to urinary retention Outcome: Progressing

## 2023-08-22 NOTE — Progress Notes (Signed)
  Progress Note   Date: 08/22/2023  Patient Name: Paul Hopkins        MRN#: [id number]   The diagnosis of sepsis     Sepsis  ruled in (please provide the supporting clinical data/criteria used) - patient had sepsis secondary to norovirus, with fever & leukocytosis on admission

## 2023-08-23 ENCOUNTER — Other Ambulatory Visit (HOSPITAL_COMMUNITY): Payer: Self-pay

## 2023-08-23 DIAGNOSIS — N3090 Cystitis, unspecified without hematuria: Secondary | ICD-10-CM | POA: Diagnosis not present

## 2023-08-23 DIAGNOSIS — G9341 Metabolic encephalopathy: Secondary | ICD-10-CM | POA: Diagnosis not present

## 2023-08-23 DIAGNOSIS — A0811 Acute gastroenteropathy due to Norwalk agent: Secondary | ICD-10-CM

## 2023-08-23 LAB — BASIC METABOLIC PANEL
Anion gap: 5 (ref 5–15)
BUN: 10 mg/dL (ref 8–23)
CO2: 27 mmol/L (ref 22–32)
Calcium: 8.2 mg/dL — ABNORMAL LOW (ref 8.9–10.3)
Chloride: 102 mmol/L (ref 98–111)
Creatinine, Ser: 0.67 mg/dL (ref 0.61–1.24)
GFR, Estimated: >60 mL/min (ref >60)
Glucose, Bld: 101 mg/dL — ABNORMAL HIGH (ref 70–99)
Potassium: 3.8 mmol/L (ref 3.5–5.1)
Sodium: 134 mmol/L — ABNORMAL LOW (ref 135–145)

## 2023-08-23 LAB — CBC
HCT: 37.7 % — ABNORMAL LOW (ref 39.0–52.0)
Hemoglobin: 12.9 g/dL — ABNORMAL LOW (ref 13.0–17.0)
MCH: 30.6 pg (ref 26.0–34.0)
MCHC: 34.2 g/dL (ref 30.0–36.0)
MCV: 89.3 fL (ref 80.0–100.0)
Platelets: 229 10*3/uL (ref 150–400)
RBC: 4.22 MIL/uL (ref 4.22–5.81)
RDW: 13.7 % (ref 11.5–15.5)
WBC: 8.9 10*3/uL (ref 4.0–10.5)
nRBC: 0 % (ref 0.0–0.2)

## 2023-08-23 MED ORDER — CEFADROXIL 500 MG PO CAPS
1000.0000 mg | ORAL_CAPSULE | Freq: Two times a day (BID) | ORAL | 0 refills | Status: DC
  Filled 2023-08-23: qty 4, 1d supply, fill #0

## 2023-08-23 MED ORDER — PAXLOVID (300/100) 20 X 150 MG & 10 X 100MG PO TBPK: 3.0000 | ORAL_TABLET | Freq: Two times a day (BID) | ORAL | 0 refills | Status: AC

## 2023-08-23 NOTE — [place] FL2 (Signed)
 [place] MEDICAID FL2 LEVEL OF CARE FORM     IDENTIFICATION  Patient Name: Paul Hopkins Birthdate: [birth year] Sex: male Admission Date (Current Location): 08/20/2023  [facility] and [place] Number:  Producer, television/film/video and Address:  [facility] [place], [address] [place], [place] [address]      Provider Number: [id number]  Attending Physician Name and Address:  Mercie Eon, MD  Relative Name and Phone Number:  Alexio Sroka  [phone]    Current Level of Care: Hospital Recommended Level of Care: Assisted Living Facility Prior Approval Number:    Date Approved/Denied:   PASRR Number: [id number]  Discharge Plan: Other (Comment) (ALF)    Current Diagnoses: Patient Active Problem List   Diagnosis Date Noted   Cystitis 08/23/2023   Norovirus 08/21/2023   Pressure injury of skin 08/21/2023   Urinary tract infection 08/21/2023   Diarrhea 08/21/2023   Acute metabolic encephalopathy 08/20/2023   Lumbar stress fracture 04/03/2023   Lumbar compression fracture, closed, initial encounter (HCC) 04/01/2023   Hypoproteinemia (HCC) 04/01/2023   Sinus bradycardia 05/19/2021   Pseudomonas infection 11/28/2019   Vascular dementia (HCC) 11/28/2019   Hypothyroidism (acquired) 11/28/2019   Palliative care by specialist    Goals of care, counseling/discussion    DNR (do not resuscitate)    Sepsis (HCC) 11/27/2019    Orientation RESPIRATION BLADDER Height & Weight     Self, Place  Normal Incontinent Weight: 154 lb 5.2 oz (70 kg) Height:  6' (182.9 cm)  BEHAVIORAL SYMPTOMS/MOOD NEUROLOGICAL BOWEL NUTRITION STATUS      Incontinent Diet (Regular)  AMBULATORY STATUS COMMUNICATION OF NEEDS Skin   Extensive Assist Verbally PU Stage and Appropriate Care (Ischial tuberosity bilateral st 1, Medial Vertebral column St 2)                       Personal Care Assistance Level of Assistance  Bathing, Feeding, Dressing Bathing Assistance: Maximum  assistance Feeding assistance: Limited assistance Dressing Assistance: Maximum assistance     Functional Limitations Info  Sight, Hearing, Speech Sight Info: Impaired Hearing Info: Impaired Speech Info: Adequate    SPECIAL CARE FACTORS FREQUENCY  PT (By licensed PT), OT (By licensed OT)     PT Frequency: 2x week OT Frequency: 2x week            Contractures Contractures Info: Not present    Additional Factors Info  Code Status, Allergies Code Status Info: DNR Allergies Info: Ariprazole           Current Medications (08/23/2023):  This is the current hospital active medication list Current Facility-Administered Medications  Medication Dose Route Frequency Provider Last Rate Last Admin   acetaminophen (TYLENOL) tablet 650 mg  650 mg Oral Q6H PRN Atway, Rayann N, DO   650 mg at 08/21/23 0606   ARIPiprazole (ABILIFY) tablet 10 mg  10 mg Oral Daily Masters, Katie, DO   10 mg at 08/23/23 1106   cefadroxil (DURICEF) capsule 1,000 mg  1,000 mg Oral BID Kathleen Lime, MD   1,000 mg at 08/23/23 1106   levothyroxine (SYNTHROID) tablet 88 mcg  88 mcg Oral QAC breakfast Masters, Katie, DO   88 mcg at 08/23/23 0545     Discharge Medications:  acetaminophen 325 MG tablet Commonly known as: TYLENOL Take 2 tablets (650 mg total) by mouth every 6 (six) hours as needed for mild pain (pain score 1-3) or moderate pain (pain score 4-6) (or Fever >/= 101).  ARIPiprazole 10 MG tablet Commonly known as: ABILIFY Take 10 mg by mouth daily.    aspirin EC 81 MG tablet Take 81 mg by mouth daily. Swallow whole. 4pm    cefadroxil 500 MG capsule Commonly known as: DURICEF Take 2 capsules (1,000 mg total) by mouth 2 (two) times daily.    D3 2000 50 MCG (2000 UT) Caps Generic drug: Cholecalciferol Take 1 capsule by mouth daily.    levothyroxine 88 MCG tablet Commonly known as: SYNTHROID Take 88 mcg by mouth daily before breakfast.    MULTI-VITAMIN PO Take 1 tablet by mouth daily.  4 pm. Centrum silver men 50+    ondansetron 4 MG tablet Commonly known as: ZOFRAN Take 1 tablet (4 mg total) by mouth every 6 (six) hours as needed for nausea or vomiting.    Paxlovid (300/100) 20 x 150 MG & 10 x 100MG  Tbpk Generic drug: nirmatrelvir/ritonavir Take 3 tablets by mouth 2 (two) times daily for 5 days. Patient GFR is WNL . Take nirmatrelvir (150 mg) two tablets twice daily for 5 days and ritonavir (100 mg) one tablet twice daily for 5 days.    PRESERVISION AREDS 2 PO Take 1 capsule by mouth 2 (two) times daily.    PROBIOTIC BLEND PO Take 1 capsule by mouth in the morning and at bedtime.    vitamin C 1000 MG tablet Take 1,000 mg by mouth daily.     Relevant Imaging Results:  Relevant Lab Results:   Additional Information SSN: [id number]  Carley Hammed, LCSW

## 2023-08-23 NOTE — Progress Notes (Signed)
   Progress Note   Date: 08/22/2023  Patient Name: Paul Hopkins        MRN#: [id number]   Clarification of the diagnosis of pressure ulcer(s):   Pressure injury Vertebral column Medial Stage 2 present on admission

## 2023-08-23 NOTE — Progress Notes (Signed)
 Physical Therapy Treatment Patient Details Name: Paul Hopkins MRN: [id number] DOB: [birth year] Today's Date: 08/23/2023   History of Present Illness Pt is a 89 y.o. male admitted 08/20/23 from [facility] for fall at night and altered mental status. Suspect UTI PMH: dementia, hypothyroidism, CVA, Mnire's disease, osteoarthritis.    PT Comments  Continuing work on functional mobility and activity tolerance;  session focused on functional transfers as well as initial attempts at gait; Notably better sitting/standing balance with significantly less posterior lean; Able to walk a short distance in the room with RW; Patient will benefit from continued inpatient follow up therapy, <3 hours/day; still we know family is in more favor of return to familiar environment; if 24 hour assist can be arranged for Paul Hopkins at ALF, getting back to his apartmetn is not unreasonable   If plan is discharge home, recommend the following: A lot of help with walking and/or transfers;A lot of help with bathing/dressing/bathroom   Can travel by private vehicle        Equipment Recommendations  Rolling walker (2 wheels);BSC/3in1    Recommendations for Other Services       Precautions / Restrictions Precautions Precautions: Fall Recall of Precautions/Restrictions: Impaired Restrictions Weight Bearing Restrictions Per Provider Order: No     Mobility  Bed Mobility Overal bed mobility: Needs Assistance Bed Mobility: Supine to Sit     Supine to sit: Min assist, HOB elevated, Used rails     General bed mobility comments: With increased time and cueing pt able to walk legs off bed and push using UB    Transfers Overall transfer level: Needs assistance Equipment used: Rolling walker (2 wheels) Transfers: Sit to/from Stand, Bed to chair/wheelchair/BSC Sit to Stand: +2 safety/equipment, +2 physical assistance, Mod assist Stand pivot transfers: +2 safety/equipment, +2 physical assistance, Mod assist          General transfer comment: Mod assist to power up and steady; took steps bed to [facility] with RW, kypotic posture increases with time in standing    Ambulation/Gait Ambulation/Gait assistance: Mod assist, +2 safety/equipment Gait Distance (Feet): 5 Feet Assistive device: Rolling walker (2 wheels) Gait Pattern/deviations: Decreased step length - right, Decreased step length - left, Trunk flexed       General Gait Details: Short steps and mod assist mostly for RW management   Stairs             Wheelchair Mobility     Tilt Bed    Modified Rankin (Stroke Patients Only)       Balance Overall balance assessment: Needs assistance, History of Falls Sitting-balance support: Bilateral upper extremity supported, Feet supported Sitting balance-Leahy Scale: Poor   Postural control: Posterior lean Standing balance support: Bilateral upper extremity supported, During functional activity, Reliant on assistive device for balance Standing balance-Leahy Scale: Poor                              Communication Communication Communication: Impaired Factors Affecting Communication: Hearing impaired  Cognition Arousal: Alert Behavior During Therapy: Flat affect                             Following commands: Impaired Following commands impaired: Follows one step commands with increased time    Cueing Cueing Techniques: Verbal cues, Tactile cues, Visual cues  Exercises      General Comments General comments (skin integrity, edema, etc.): O2 sats 99%  on room airBack appears red and irritated      Pertinent Vitals/Pain Pain Assessment Pain Assessment: No/denies pain    Home Living                          Prior Function            PT Goals (current goals can now be found in the care plan section) Acute Rehab PT Goals PT Goal Formulation: Patient unable to participate in goal setting Time For Goal Achievement: 09/04/23 Potential to  Achieve Goals: Good Progress towards PT goals: Progressing toward goals    Frequency    Min 2X/week      PT Plan      Co-evaluation PT/OT/SLP Co-Evaluation/Treatment: Yes (dovetail) Reason for Co-Treatment: For patient/therapist safety;To address functional/ADL transfers (helop with dc discernment) PT goals addressed during session: Mobility/safety with mobility OT goals addressed during session: ADL's and self-care;Strengthening/ROM      AM-PAC PT "6 Clicks" Mobility   Outcome Measure  Help needed turning from your back to your side while in a flat bed without using bedrails?: A Lot Help needed moving from lying on your back to sitting on the side of a flat bed without using bedrails?: A Little Help needed moving to and from a bed to a chair (including a wheelchair)?: A Lot Help needed standing up from a chair using your arms (e.g., wheelchair or bedside chair)?: A Lot Help needed to walk in hospital room?: A Lot Help needed climbing 3-5 steps with a railing? : Total 6 Click Score: 12    End of Session Equipment Utilized During Treatment: Gait belt Activity Tolerance: Patient tolerated treatment well Patient left: in chair;with call bell/phone within reach;with chair alarm set Nurse Communication: Mobility status PT Visit Diagnosis: Unsteadiness on feet (R26.81);Muscle weakness (generalized) (M62.81)     Time: 6045-4098 PT Time Calculation (min) (ACUTE ONLY): 21 min  Charges:    $Gait Training: 8-22 mins PT General Charges $$ ACUTE PT VISIT: 1 Visit                     Van Clines, PT  Acute Rehabilitation Services Office [phone] Secure Chat welcomed    Levi Aland 08/23/2023, 11:43 AM

## 2023-08-23 NOTE — TOC Progression Note (Signed)
 Transition of Care [facility]) - Progression Note    Patient Details  Name: Rodrigo Mcgranahan MRN: [id number] Date of Birth: 02/01/1934  Transition of Care [facility]) CM/SW Contact  Carley Hammed, LCSW Phone Number: 08/23/2023, 12:26 PM  Clinical Narrative:    CSW spoke with DON at [facility] and confirmed pt can DC back when ready. FL2 started and DC summary/ FL2 can be faxed to [phone] Attn Amber. CSW notified medical team and family of plan. CM to send orders to [facility] for [facility]. Dtr requests [facility] transport. TOC will continue to follow.    Expected Discharge Plan: Assisted Living Barriers to Discharge: Continued Medical Work up, Other (must enter comment) (Back to ALF when approved.)  Expected Discharge Plan and Services In-house Referral: Clinical Social Work   Post Acute Care Choice: Resumption of Svcs/PTA Provider, Home Health (ALF) Living arrangements for the past 2 months: Assisted Living Facility                                       Social Determinants of Health (SDOH) Interventions SDOH Screenings   Food Insecurity: Patient Declined (08/20/2023)  Housing: Unknown (08/20/2023)  Transportation Needs: Patient Declined (08/20/2023)  Utilities: Patient Declined (08/20/2023)  Social Connections: Unknown (08/20/2023)  Tobacco Use: Low Risk  (08/20/2023)    Readmission Risk Interventions     No data to display

## 2023-08-23 NOTE — TOC Transition Note (Signed)
 Transition of Care [facility]) - Discharge Note   Patient Details  Name: Paul Hopkins MRN: [id number] Date of Birth: 06/17/1933  [facility]) CM/SW Contact:  Carley Hammed, LCSW Phone Number: 08/23/2023, 3:24 PM   Clinical Narrative:    Pt to be transported to [facility] via [facility], Nurse to call report to [phone]. CM faxed PT/OT orders to [facility]. FL2 and DC summary to be faxed to facility. Dtr requests pt have dinner and she be notified when he leaves. TOC to sign off.    Final next level of care: Assisted Living Barriers to Discharge: Barriers Resolved   Patient Goals and CMS Choice Patient states their goals for this hospitalization and ongoing recovery are:: Pt disoriented and unable to participate in goal setting. CMS Medicare.gov Compare Post Acute Care list provided to:: Patient Represenative (must comment) Choice offered to / list presented to : Adult Children      Discharge Placement              Patient chooses bed at: [facility] Patient to be transferred to facility by: [facility] Name of family member notified: Summer Patient and family notified of of transfer: 08/23/23  Discharge Plan and Services Additional resources added to the After Visit Summary for   In-house Referral: Clinical Social Work   Post Acute Care Choice: Resumption of Svcs/PTA Provider, Home Health (ALF)                               Social Drivers of Health (SDOH) Interventions SDOH Screenings   Food Insecurity: Patient Declined (08/20/2023)  Housing: Unknown (08/20/2023)  Transportation Needs: Patient Declined (08/20/2023)  Utilities: Patient Declined (08/20/2023)  Social Connections: Unknown (08/20/2023)  Tobacco Use: Low Risk  (08/20/2023)     Readmission Risk Interventions     No data to display

## 2023-08-23 NOTE — Discharge Instructions (Addendum)
 Dear Mr Poteete, It was a pleasure taking care of you at [facility]. You were admitted for nausea and vomiting.  Your hospital course was complicated by hematuria and you are also found to have Norovirus. We are discharging you home now that you are doing better. Please follow the following instructions.  1) For your nausea and vomiting, I suspect that it was from your norovirus. I am glad to see that that has resolved during this hospitalization.  Please let us know if this comes back. 2) For your blood in your urine, urology saw you and the good news is that  has resolved.  I would recommend you follow-up with urology outpatient after discharge.  Irine Seal, MD [facility] . [address] [place], [address] [place] [facility] [place], [place], [place] [address] Phone : [phone]   Take care,  Dr. Kathleen Lime, MD

## 2023-08-23 NOTE — Discharge Summary (Signed)
 Name: Paul Hopkins MRN: [id number] DOB: Dec 23, 1933 89 y.o. PCP: Eloisa Northern, MD  Date of Admission: 08/20/2023  8:44 AM Date of Discharge: 08/23/2023 Attending Physician: Dr.  Lafonda Mosses  Discharge Diagnosis: Principal Problem:   Acute metabolic encephalopathy Active Problems:   Norovirus   Pressure injury of skin   Urinary tract infection   Diarrhea   Cystitis    Discharge Medications: Allergies as of 08/23/2023       Reactions   Amoxicillin Other (See Comments)   Reaction??   Banana Other (See Comments)   Hip pain   Bean Pod Extract Diarrhea, Other (See Comments)   Explosive diarrhea   Blackberry [rubus Fruticosus] Other (See Comments)   Flares the patient's Mnire's disease   Broccoli [brassica Oleracea]    Carrot Oil Diarrhea, Other (See Comments)   Explosive diarrhea   Ciprofloxacin Other (See Comments)   Reaction??   Citric Acid Diarrhea, Other (See Comments)   Explosive diarrhea   Citrus Diarrhea, Other (See Comments)   Explosive diarrhea   Corn-containing Products Other (See Comments)   Explosive diarrhea   Egg [egg-derived Products] Diarrhea, Other (See Comments)   Explosive diarrhea   Milk-related Compounds Diarrhea, Other (See Comments)   Explosive diarrhea   Other Diarrhea, Other (See Comments)   NO BERRIES WITH TINY SEEDS: Flares the patient's Mnire's disease  Bread- Explosive diarrhea   Peanut-containing Drug Products Diarrhea, Other (See Comments)   NO LEGUMES!! NO PEAS!! Explosive diarrhea   Raspberry Other (See Comments)   Flares the patient's Mnire's disease   Salmon [fish Oil]    Salmon-oats-squash [alitraq]    Strawberry Extract Other (See Comments)   Flares the patient's Mnire's disease   Sulfa Antibiotics Other (See Comments)   Reaction??   Trimethoprim Other (See Comments)   Reaction??   Whey Diarrhea, Other (See Comments)   Explosive diarrhea        Medication List     STOP taking these medications    benzonatate  100 MG capsule Commonly known as: TESSALON       TAKE these medications    acetaminophen 325 MG tablet Commonly known as: TYLENOL Take 2 tablets (650 mg total) by mouth every 6 (six) hours as needed for mild pain (pain score 1-3) or moderate pain (pain score 4-6) (or Fever >/= 101).   ARIPiprazole 10 MG tablet Commonly known as: ABILIFY Take 10 mg by mouth daily.   aspirin EC 81 MG tablet Take 81 mg by mouth daily. Swallow whole. 4pm   cefadroxil 500 MG capsule Commonly known as: DURICEF Take 2 capsules (1,000 mg total) by mouth 2 (two) times daily.   D3 2000 50 MCG (2000 UT) Caps Generic drug: Cholecalciferol Take 1 capsule by mouth daily.   levothyroxine 88 MCG tablet Commonly known as: SYNTHROID Take 88 mcg by mouth daily before breakfast.   MULTI-VITAMIN PO Take 1 tablet by mouth daily. 4 pm. Centrum silver men 50+   ondansetron 4 MG tablet Commonly known as: ZOFRAN Take 1 tablet (4 mg total) by mouth every 6 (six) hours as needed for nausea or vomiting.   Paxlovid (300/100) 20 x 150 MG & 10 x 100MG  Tbpk Generic drug: nirmatrelvir/ritonavir Take 3 tablets by mouth 2 (two) times daily for 5 days. Patient GFR is WNL . Take nirmatrelvir (150 mg) two tablets twice daily for 5 days and ritonavir (100 mg) one tablet twice daily for 5 days.   PRESERVISION AREDS 2 PO Take 1 capsule by mouth  2 (two) times daily.   PROBIOTIC BLEND PO Take 1 capsule by mouth in the morning and at bedtime.   vitamin C 1000 MG tablet Take 1,000 mg by mouth daily.        Disposition and follow-up:   Paul Hopkins was discharged from [facility] in Good condition.  At the hospital follow up visit please address:  1.  Follow-up:  a.  For his hematuria, please ensure adequate follow-up with urology to further assess.    b.  For his norovirus and diarrhea, please assess resolution of diarrhea and abdominal pain   C.  For his UTI, please assess if he has any  dysuria or any suprapubic pain.  2.  Labs / imaging needed at time of follow-up: N/A  3.  Pending labs/ test needing follow-up: N/A  4.  Medication Changes  No medication changes made during this hospitalization  Follow-up Appointments: Irine Seal, MD [facility] . [address] [place], [address] [place] [facility] [place], [place], [place] [address] Phone : [phone] PLEASE CALL TO SET [facility] Course by problem list: #Acute Metabolic Encephalopathy #UTI #Fall Mr Mounce initially presented due to concerns for fall  and confusion which was thought to be due to dehydration in the setting of diarrhea ,secondary to norovirus. With supportive care, patient's diarrhea and encephalopathy improved in the course of this hospitalization.He became  more awake and was answering questions appropriately.  He was started on IV ceftriaxone and was switched to p.o. cefadroxil 1g twice daily for 3 days after his UA culture grew Proteus.  On discharge his white count has resolved to 8.9 from 18.3.  Patient is being discharged home with cefpodoxime 1 g BID for 1 more day.  Therapy continuing to work on the patient during hospitalization.  #Gross hematuria with blood clot  Patient's hospital course was complicated by gross hematuria blood clot.  Urology was consulted who recommended conservative measures.His hematuria resolved within 24 hours after irrigation.  Patient is recommended to [facility] with [facility].  #Hypothyroidism We continued on home synthroid 88 mcg daily   #Vascular Dementia We continued home Abilify 10 mg daily    Discharge Subjective: Mr. Paul Hopkins was seen at bedside today, he was more awake and said he was feeling much better than when he initially came in.  He denies any abdominal pain, nausea, vomiting and said his diarrhea has significantly improved.  He is agreeable with the plan to discharge back to his facility  today.   Discharge Exam:   BP (!) 153/61 (BP Location: Left Arm)   Pulse 63   Temp 98.4 F (36.9 C) (Oral)   Resp 18   Ht 6' (1.829 m)   Wt 70 kg   SpO2 96%   BMI 20.93 kg/m  Constitutional: well-appearing man ,frail , sitting in chair , in no acute distress HENT: normocephalic atraumatic, mucous membranes moist Eyes: conjunctiva non-erythematous Neck: supple Cardiovascular: regular rate and rhythm, no m/r/g Pulmonary/Chest: normal work of breathing on room air, lungs clear to auscultation bilaterally Abdominal: soft, non-tender, non-distended MSK: normal bulk and tone Neurological: alert & oriented x 3, 5/5 strength in bilateral upper and lower extremities, normal gait Skin: warm and dry Psych: Normal mood and affect  Pertinent Labs, Studies, and Procedures:     Latest Ref Rng & Units 08/23/2023    6:10 AM 08/22/2023    6:54 AM 08/21/2023    4:26 AM  CBC  WBC 4.0 - 10.5 K/uL 8.9  18.3  21.9   Hemoglobin 13.0 - 17.0 g/dL 13.0  86.5  78.4   Hematocrit 39.0 - 52.0 % 37.7  36.5  34.6   Platelets 150 - 400 K/uL 229  205  181        Latest Ref Rng & Units 08/23/2023    6:10 AM 08/22/2023    6:54 AM 08/21/2023    4:26 AM  CMP  Glucose 70 - 99 mg/dL 696  295  284   BUN 8 - 23 mg/dL 10  13  16    Creatinine 0.61 - 1.24 mg/dL 1.32  4.40  1.02   Sodium 135 - 145 mmol/L 134  132  134   Potassium 3.5 - 5.1 mmol/L 3.8  3.6  3.2   Chloride 98 - 111 mmol/L 102  103  103   CO2 22 - 32 mmol/L 27  22  24    Calcium 8.9 - 10.3 mg/dL 8.2  8.0  7.8     CT Cervical Spine Wo Contrast Result Date: 08/20/2023 CLINICAL DATA:  89 year old male with altered mental status. Diarrhea, fall. Fever 101 F EXAM: CT CERVICAL SPINE WITHOUT CONTRAST TECHNIQUE: Multidetector CT imaging of the cervical spine was performed without intravenous contrast. Multiplanar CT image reconstructions were also generated. RADIATION DOSE REDUCTION: This exam was performed according to the departmental dose-optimization  program which includes automated exposure control, adjustment of the mA and/or kV according to patient size and/or use of iterative reconstruction technique. COMPARISON:  Head CT today reported separately. CTA head and neck 02/13/2020. FINDINGS: Alignment: Stable and mildly exaggerated cervical lordosis. Cervicothoracic junction alignment is within normal limits. Bilateral posterior element alignment is within normal limits. Skull base and vertebrae: Stable bone mineralization since 2021. Visualized skull base is intact. No atlanto-occipital dissociation. C1 and C2 appear intact and aligned. No acute osseous abnormality identified. Soft tissues and spinal canal: No prevertebral fluid or swelling. No visible canal hematoma. Negative visible noncontrast neck soft tissues, small volume right IJ gas likely related to recent intravenous access. Disc levels: Bulky chronic cervical ligament flavum degeneration, hypertrophy which is sometimes calcified. Chronic disc and endplate degeneration. Subtle degenerative appearing anterolisthesis of C4 on C5. This is not significantly changed since 2021 and underlying spinal canal fairly capacious. Mild if any associated spinal stenosis. Upper chest: Osteopenia. Visible upper thoracic levels appear intact. Negative lung apices. IMPRESSION: 1. No acute traumatic injury identified in the cervical spine. 2. Chronic cervical spine degeneration not significantly changed since 2021. Electronically Signed   By: Odessa Fleming M.D.   On: 08/20/2023 11:17   CT Head Wo Contrast Result Date: 08/20/2023 CLINICAL DATA:  89 year old male with altered mental status. Diarrhea, fall. Fever 101 F EXAM: CT HEAD WITHOUT CONTRAST TECHNIQUE: Contiguous axial images were obtained from the base of the skull through the vertex without intravenous contrast. RADIATION DOSE REDUCTION: This exam was performed according to the departmental dose-optimization program which includes automated exposure control,  adjustment of the mA and/or kV according to patient size and/or use of iterative reconstruction technique. COMPARISON:  Brain MRI 10/28/2021.  Head CT 10/27/2021. FINDINGS: Brain: Cerebral volume not significantly changed. No midline shift, ventriculomegaly, mass effect, evidence of mass lesion, intracranial hemorrhage or evidence of cortically based acute infarction. Patchy and confluent chronic bilateral white matter disease does not appear significantly changed from 2023, including involvement of the right external capsule. Stable gray-white differentiation. Vascular: Chronic severe intracranial artery dolichoectasia, especially the basilar artery. Extensive Calcified  atherosclerosis at the skull base. No suspicious intracranial vascular hyperdensity. Skull: Intact.  No acute osseous abnormality identified. Sinuses/Orbits: Moderate new paranasal sinus mucosal thickening most pronounced in the ethmoids. Tympanic cavities and mastoids remain clear. Other: No orbit or scalp soft tissue injury identified. IMPRESSION: 1. No acute intracranial abnormality or acute traumatic injury identified. 2. 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 3. New moderate paranasal sinus inflammation. Electronically Signed   By: Odessa Fleming M.D.   On: 08/20/2023 11:13   DG Hip Unilat With Pelvis 2-3 Views Left Result Date: 08/20/2023 CLINICAL DATA:  Fall. EXAM: DG HIP (WITH OR WITHOUT PELVIS) 2-3V LEFT COMPARISON:  None Available. FINDINGS: There is no evidence of hip fracture or dislocation. There is no evidence of arthropathy or other focal bone abnormality. IMPRESSION: Negative. Electronically Signed   By: Kennith Center M.D.   On: 08/20/2023 10:58   DG Pelvis Portable Result Date: 08/20/2023 CLINICAL DATA:  fall EXAM: PORTABLE PELVIS 1-2 VIEWS COMPARISON:  July 11, 2023 FINDINGS: Osteopenia. There is linear sclerosis along the LEFT  subtrochanteric femur. No definitive displaced fracture is visualized. Degenerative changes of the lower lumbar spine. Sacrum is obscured by overlapping bowel contents. Vascular calcifications. IMPRESSION: There is linear sclerosis along the LEFT subtrochanteric femur. This is nonspecific and could a nondisplaced fracture versus summation artifact. Recommend dedicated hip radiographs for further evaluation. Electronically Signed   By: Meda Klinefelter M.D.   On: 08/20/2023 09:49   DG Chest Port 1 View Result Date: 08/20/2023 CLINICAL DATA:  Diarrhea, fall, questionable sepsis EXAM: PORTABLE CHEST 1 VIEW COMPARISON:  08/15/2023 chest radiograph. FINDINGS: Stable cardiomediastinal silhouette with normal heart size. No pneumothorax. No pleural effusion. Lungs appear clear, with no acute consolidative airspace disease and no pulmonary edema. IMPRESSION: No active disease. Electronically Signed   By: Delbert Phenix M.D.   On: 08/20/2023 09:46     Discharge Instructions: Discharge Instructions     Diet - low sodium heart healthy   Complete by: As directed    Discharge instructions   Complete by: As directed    Dear Mr Heinz,  It was a pleasure taking care of you at [facility]. You were admitted for nausea and vomiting.  Your hospital course was complicated by hematuria and you are also found to have Norovirus. We are discharging you home now that you are doing better. Please follow the following instructions.  1) For your nausea and vomiting, I suspect that it was from your norovirus. I am glad to see that that has resolved during this hospitalization.  Please let us know if this comes back. 2) For your blood in your urine, urology saw you and the good news is that  has resolved.  I would recommend you follow-up with urology outpatient after discharge.  Irine Seal, MD [facility] . [address] [place], [address] [place] [facility] [place], [place], [place] [address] Phone :  [phone]   Take care,  Dr. Kathleen Lime, MD   Increase activity slowly   Complete by: As directed    Increase activity slowly   Complete by: As directed    No wound care   Complete by: As directed    No wound care   Complete by: As directed        Signed: Kathleen Lime, MD Internal Medicine Teaching Service Pager [phone]

## 2023-08-23 NOTE — Progress Notes (Signed)
 Occupational Therapy Treatment Patient Details Name: Paul Hopkins MRN: [id number] DOB: Oct 07, 1933 Today's Date: 08/23/2023   History of present illness Pt is a 89 y.o. male admitted 08/20/23 from [name] for fall at night and altered mental status. Suspect UTI PMH: dementia, hypothyroidism, CVA, Mnire's disease, osteoarthritis.   OT comments  Pt appeared more alert and organized today. With increased time and cueing pt able to complete bed mobility with min assist. Mod assist +2 for transfer to [facility]. Pt progressing well towards goals, but is limited by decreased balance, strength, and activity tolerance. Recommendation of <3 hours of skilled rehab daily to optimize independence levels and promote safe discharge back to ALF. OT will continue to follow acutely to increase functional independence.      If plan is discharge home, recommend the following:  Two people to help with walking and/or transfers;Two people to help with bathing/dressing/bathroom;Direct supervision/assist for medications management;Direct supervision/assist for financial management   Equipment Recommendations  Other (comment) (Defer to next venue)    Recommendations for Other Services      Precautions / Restrictions Precautions Precautions: Fall Recall of Precautions/Restrictions: Impaired Restrictions Weight Bearing Restrictions Per Provider Order: No       Mobility Bed Mobility Overal bed mobility: Needs Assistance Bed Mobility: Supine to Sit     Supine to sit: Min assist, HOB elevated, Used rails     General bed mobility comments: With increased time and cueing pt able to walk legs off bed and push using UB    Transfers Overall transfer level: Needs assistance Equipment used: Rolling walker (2 wheels) Transfers: Sit to/from Stand, Bed to chair/wheelchair/BSC Sit to Stand: +2 safety/equipment, +2 physical assistance, Mod assist Stand pivot transfers: +2 safety/equipment, +2 physical  assistance, Mod assist               Balance Overall balance assessment: Needs assistance, History of Falls Sitting-balance support: Bilateral upper extremity supported, Feet supported Sitting balance-Leahy Scale: Poor   Postural control: Posterior lean Standing balance support: Bilateral upper extremity supported, During functional activity, Reliant on assistive device for balance Standing balance-Leahy Scale: Poor                             ADL either performed or assessed with clinical judgement   ADL Overall ADL's : Needs assistance/impaired                         Toilet Transfer: Moderate assistance;+2 for physical assistance;+2 for safety/equipment;Cueing for safety;Cueing for sequencing;Stand-pivot;BSC/3in1;Rolling walker (2 wheels) Toilet Transfer Details (indicate cue type and reason): +2 for safety heavy cueing for sequencing Toileting- Clothing Manipulation and Hygiene: +2 for physical assistance;Total assistance;Sit to/from stand Toileting - Clothing Manipulation Details (indicate cue type and reason): Max assist to stand, +2 for wiping total A            Extremity/Trunk Assessment Upper Extremity Assessment Upper Extremity Assessment: Generalized weakness   Lower Extremity Assessment Lower Extremity Assessment: Generalized weakness        Vision   Vision Assessment?: No apparent visual deficits   Perception     Praxis     Communication Communication Communication: Impaired Factors Affecting Communication: Hearing impaired   Cognition Arousal: Alert Behavior During Therapy: Flat affect Cognition: History of cognitive impairments             OT - Cognition Comments: Per daughter cog fairly intact, hearing impairment limits  pt                 Following commands: Impaired Following commands impaired: Follows one step commands with increased time      Cueing   Cueing Techniques: Verbal cues, Tactile cues,  Visual cues  Exercises      Shoulder Instructions       General Comments Back appears red and irritated    Pertinent Vitals/ Pain       Pain Assessment Pain Assessment: No/denies pain  Home Living                                          Prior Functioning/Environment              Frequency  Min 2X/week        Progress Toward Goals  OT Goals(current goals can now be found in the care plan section)  Progress towards OT goals: Progressing toward goals  Acute Rehab OT Goals OT Goal Formulation: With patient Time For Goal Achievement: 09/04/23 Potential to Achieve Goals: Good ADL Goals Pt Will Perform Upper Body Dressing: with set-up;sitting Pt Will Perform Lower Body Dressing: with mod assist;sit to/from stand Pt Will Perform Toileting - Clothing Manipulation and hygiene: with mod assist;sitting/lateral leans;sit to/from stand Additional ADL Goal #1: Pt will complete bed mobility with min assist to aid in ADLs  Plan      Co-evaluation    PT/OT/SLP Co-Evaluation/Treatment: Yes Reason for Co-Treatment: For patient/therapist safety;To address functional/ADL transfers   OT goals addressed during session: ADL's and self-care;Strengthening/ROM      AM-PAC OT "6 Clicks" Daily Activity     Outcome Measure   Help from another person eating meals?: A Little Help from another person taking care of personal grooming?: A Little Help from another person toileting, which includes using toliet, bedpan, or urinal?: Total Help from another person bathing (including washing, rinsing, drying)?: A Lot Help from another person to put on and taking off regular upper body clothing?: A Little Help from another person to put on and taking off regular lower body clothing?: Total 6 Click Score: 13    End of Session Equipment Utilized During Treatment: Gait belt;Rolling walker (2 wheels);Other (comment) (BSC)  OT Visit Diagnosis: Unsteadiness on feet  (R26.81);Other abnormalities of gait and mobility (R26.89);Muscle weakness (generalized) (M62.81)   Activity Tolerance Patient tolerated treatment well   Patient Left in chair;with call bell/phone within reach;with chair alarm set   Nurse Communication Mobility status;Other (comment) (Dressing change for bottom)        Time: 1610-9604 OT Time Calculation (min): 26 min  Charges: OT General Charges $OT Visit: 1 Visit OT Treatments $Self Care/Home Management : 8-22 mins  Ivor Messier, OT  Acute Rehabilitation Services Office [phone] Secure chat preferred   Marilynne Drivers 08/23/2023, 9:58 AM

## 2023-08-23 NOTE — Progress Notes (Signed)
 3/12 Patient under Contact and Airborne Precautions, IMM Letter given to RN assigned to patient to give to patient at bedside.

## 2023-08-23 NOTE — Progress Notes (Signed)
   Progress Note   Date: 08/22/2023  Patient Name: Paul Hopkins        MRN#: [id number]   Clarification of the diagnosis of pressure ulcer(s):  Pressure injury Ischial tuberosity Bilateral Stage 1  present on admission

## 2023-08-23 NOTE — Progress Notes (Signed)
 Attempted to call report to [phone]. Voice message left with this RN's contact info left, asking to be returned a call.

## 2023-08-23 NOTE — Care Management Important Message (Signed)
 Important Message  Patient Details  Name: Paul Hopkins MRN: [id number] Date of Birth: 1933/11/28   Important Message Given:  Yes - Medicare IM     Sherilyn Banker 08/23/2023, 2:50 PM

## 2023-08-24 ENCOUNTER — Ambulatory Visit: Payer: Medicare Other | Admitting: Podiatry

## 2023-08-25 LAB — CULTURE, BLOOD (ROUTINE X 2)
Culture: NO GROWTH
Culture: NO GROWTH
Special Requests: ADEQUATE
Special Requests: ADEQUATE

## 2023-09-05 ENCOUNTER — Encounter: Payer: Self-pay | Admitting: Podiatry

## 2023-09-05 ENCOUNTER — Ambulatory Visit (INDEPENDENT_AMBULATORY_CARE_PROVIDER_SITE_OTHER): Admitting: Podiatry

## 2023-09-05 DIAGNOSIS — B351 Tinea unguium: Secondary | ICD-10-CM | POA: Diagnosis not present

## 2023-09-05 DIAGNOSIS — M79675 Pain in left toe(s): Secondary | ICD-10-CM

## 2023-09-05 DIAGNOSIS — M79674 Pain in right toe(s): Secondary | ICD-10-CM

## 2023-09-05 NOTE — Progress Notes (Signed)
 This patient presents to the office with chief complaint of long thick painful nails.  Patient says the nails are painful walking and wearing shoes.  This patient is unable to self treat.  This patient is unable to trim his  nails since she is unable to reach his  nails.  he presents to the office for preventative foot care services.  General Appearance  Alert, conversant and in no acute stress.  Vascular  Dorsalis pedis and posterior tibial  pulses are  weakly palpable  bilaterally.  Capillary return is within normal limits  bilaterally. Temperature is within normal limits  bilaterally.  Neurologic  Senn-Weinstein monofilament wire test within normal limits  bilaterally. Muscle power within normal limits bilaterally.  Nails Thick disfigured discolored nails with subungual debris  from hallux to fifth toes bilaterally. No evidence of bacterial infection or drainage bilaterally.   Orthopedic  No limitations of motion  feet .  No crepitus or effusions noted.  No bony pathology or digital deformities noted.  Skin  normotropic skin with no porokeratosis noted bilaterally.  No signs of infections or ulcers noted.     Onychomycosis  Nails  B/L.  Pain in right toes  Pain in left toes  Debridement of nails both feet followed trimming the nails with dremel tool.  Cauterize second toe left foot.   RTC  10 weeks    Helane Gunther DPM

## 2023-10-18 ENCOUNTER — Telehealth: Payer: Self-pay | Admitting: Gastroenterology

## 2023-10-18 NOTE — Telephone Encounter (Signed)
 Patient daughter requesting to speak with a nurse in regards to patient having abdominal pain.

## 2023-10-19 NOTE — Telephone Encounter (Signed)
 KUB  and labs received

## 2023-10-19 NOTE — Telephone Encounter (Signed)
 Spoke with patient's daughter Debbi Failing & she stated patient started to have diarrhea again over the last few weeks. She's unsure if it is overflow diarrhea again (facility stopped giving him miralax ) or if it could be something else causing it. PCP did an abd XR and stated there was no obstruction. She is not confident in their answer, and would like Dr. Venice Gillis to review. Our fax number has been provided & she will have their office fax over the results. He has upcoming OV on 10/31/23 & advised she keep it as planned for now.

## 2023-10-24 NOTE — Telephone Encounter (Signed)
 Spoke with Pt daughter Debbi Failing. Debbi Failing stated that he has the x ray done through a mobile unit that came by his residence with a company called [facility] [employer] [facility] and was provided the Nurses name and number Therapist, occupational Iva [phone])  Left message for [employer] to call back

## 2023-10-24 NOTE — Telephone Encounter (Signed)
 PCP on file called and was notified that pty is no longer a pt of theres any more.  [facility] called and was notified that they dont have him listed as a pt their. Attempted to reach pt daughter twice and phone rang and then went to complete silence with no answering machine.

## 2023-10-24 NOTE — Telephone Encounter (Signed)
 Apologize- No x-ray KUB report on my desk.  Can we please trace it.  Was it done in [facility]?

## 2023-10-25 NOTE — Telephone Encounter (Signed)
 Please see notes below Received fax with Xray and labs on Pt. Copies made and sent to be scanned into epic. Copy made and placed on Dr. Venice Gillis desk for review.  Please review and advise

## 2023-10-26 NOTE — Telephone Encounter (Signed)
 X-ray KUB 10/10/2023 Moderate stool in the colon.  Nonspecific bowel gas pattern without obstruction.  Plan: - Please take MiraLAX  17 g p.o. twice daily until a good bowel movement, then continue once a day for now - Repeat x-ray KUB in 2 weeks just like before  RG

## 2023-10-27 NOTE — Telephone Encounter (Signed)
 Pt daughter Paul Hopkins  was made aware of recent results and Dr. Venice Gillis recommendations: Daughter requested that order for x ray placed through recent company that did the prior xray. [name] was contacted. Marine requested that order be faxed to [phone]. Order was faxed with conformation received.

## 2023-10-27 NOTE — Telephone Encounter (Signed)
 Pt daughter made aware [name] was contacted. Marine requested that order be faxed to [phone]. Order was faxed with conformation received.  Marine  verbalized understanding with all questions answered.

## 2023-10-31 ENCOUNTER — Ambulatory Visit (INDEPENDENT_AMBULATORY_CARE_PROVIDER_SITE_OTHER): Admitting: Gastroenterology

## 2023-10-31 ENCOUNTER — Encounter: Payer: Self-pay | Admitting: Gastroenterology

## 2023-10-31 VITALS — BP 132/68 | HR 55 | Ht 72.0 in | Wt 131.0 lb

## 2023-10-31 DIAGNOSIS — N390 Urinary tract infection, site not specified: Secondary | ICD-10-CM

## 2023-10-31 DIAGNOSIS — K582 Mixed irritable bowel syndrome: Secondary | ICD-10-CM

## 2023-10-31 DIAGNOSIS — F0390 Unspecified dementia without behavioral disturbance: Secondary | ICD-10-CM

## 2023-10-31 DIAGNOSIS — R634 Abnormal weight loss: Secondary | ICD-10-CM | POA: Diagnosis not present

## 2023-10-31 NOTE — Patient Instructions (Addendum)
 _______________________________________________________  If your blood pressure at your visit was 140/90 or greater, please contact your primary care physician to follow up on this.  _______________________________________________________  If you are age 88 or older, your body mass index should be between 23-30. Your Body mass index is 17.77 kg/m. If this is out of the aforementioned range listed, please consider follow up with your Primary Care Provider.  If you are age 41 or younger, your body mass index should be between 19-25. Your Body mass index is 17.77 kg/m. If this is out of the aformentioned range listed, please consider follow up with your Primary Care Provider.   ________________________________________________________  The [facility] providers would like to encourage you to use MYCHART to communicate with providers for non-urgent requests or questions.  Due to long hold times on the telephone, sending your provider a message by [facility] may be a faster and more efficient way to get a response.  Please allow 48 business hours for a response.  Please remember that this is for non-urgent requests.  _______________________________________________________  Please have [facility] to fax us  the CT results to [phone] please   Please purchase the following medications over the counter and take as directed: Miralax  17g  daily  Thank you,  Dr. Lajuan Pila

## 2023-10-31 NOTE — Progress Notes (Signed)
 Chief Complaint: FU  Referring Provider:  Amin, Saad, MD      ASSESSMENT AND PLAN;   #1. IBS-D. Now with constipation  #2. Advanced dementia with wt loss.  #3. Recent UTI/urosepsis.  Plan:  -Miralax  17g po every day -Has Appt with urology for CT AP with contast. -Brooke to get CT report in 2 weeks. -Can cancel X ray KUB. -Very frail for any endoscopic evaluation    HPI:    Paul Hopkins is a 89 y.o. male  Accompanied by his daughter Previous patient of Dr. Savannah Curlin With previously diagnosed IBS-D, vascular dementia with delusional disorder, hypothyroidism, CVA in 2017, history of Mnire's disease with a hearing deficit.  He was diagnosed with IBS in the past.  He has multiple food allergies and intolerances as below.  He lives at [facility] in independent living.   Discussed the use of AI scribe software for clinical note transcription with the patient, who gave verbal consent to proceed.  History of Present Illness Paul Hopkins is an 89 year old male who presents with previous history of diarrhea, now with constipation.  He has been experiencing constipation for the last six months, confirmed by x-rays showing moderate fecal retention in the colon. He has been using Miralax  intermittently, which initially led to a decent bowel movement, but the dosage was reduced due to concerns about diarrhea. He resumed Miralax  on Friday and reported a small bowel movement. His constipation is likely exacerbated by decreased mobility and recent illnesses, including COVID-19, norovirus, and a septic UTI. He also experiences periodic diarrhea, complicating the management of his constipation.  He has a history of thyroid  issues, with a recent blood test showing a thyroid  level of 5.13. He is on thyroid  medication, which he has been taking with food, and efforts are being made to adjust the timing for better absorption.  He has experienced weight loss and decreased mobility  due to hip pain earlier in the year. His overall health and activity level have been impacted by COVID-19, norovirus, and a septic UTI.  No complaints of pain, no sundowning or worsening of dementia symptoms.     Past GI workup: -Colonoscopy in [place]  >10 yrs ago- neg. No need for repeat per daughter -CT Abdo/pelvis without contrast 11/2019: neg for any acute abnormalities.  Marked prostate enlargement.  Wt Readings from Last 3 Encounters:  10/31/23 131 lb (59.4 kg)  08/20/23 154 lb 5.2 oz (70 kg)  08/15/23 154 lb 5.2 oz (70 kg)    Past Medical History:  Diagnosis Date   Bradycardia    Delusional disorder (HCC)    Dementia (HCC)    Hypothyroidism    Hypothyroidism    Meniere disease    Non-pressure chronic ulcer of buttock (HCC)    Osteoarthritis    Stroke (HCC)    Vascular dementia (HCC)     Past Surgical History:  Procedure Laterality Date   EXCISIONAL HEMORRHOIDECTOMY      Family History  Problem Relation Age of Onset   Colon cancer Father    Esophageal cancer Neg Hx    Pancreatic cancer Neg Hx    Stomach cancer Neg Hx     Social History   Tobacco Use   Smoking status: Never   Smokeless tobacco: Never  Vaping Use   Vaping status: Never Used  Substance Use Topics   Alcohol use: Never   Drug use: Never    Current Outpatient Medications  Medication Sig Dispense Refill  acetaminophen  (TYLENOL ) 325 MG tablet Take 2 tablets (650 mg total) by mouth every 6 (six) hours as needed for mild pain (pain score 1-3) or moderate pain (pain score 4-6) (or Fever >/= 101).     ARIPiprazole  (ABILIFY ) 10 MG tablet Take 10 mg by mouth daily.     Ascorbic Acid (VITAMIN C) 1000 MG tablet Take 1,000 mg by mouth daily.     aspirin  EC 81 MG tablet Take 81 mg by mouth daily. Swallow whole. 4pm     Cholecalciferol  (D3 2000) 50 MCG (2000 UT) CAPS Take 1 capsule by mouth daily.     levothyroxine  (SYNTHROID ) 88 MCG tablet Take 88 mcg by mouth daily before breakfast.     Multiple  Vitamin (MULTI-VITAMIN PO) Take 1 tablet by mouth daily. 4 pm. Centrum silver men 50+     Multiple Vitamins-Minerals (PRESERVISION AREDS 2 PO) Take 1 capsule by mouth 2 (two) times daily.     polyethylene glycol (MIRALAX  / GLYCOLAX ) 17 g packet Take 17 g by mouth daily as needed for mild constipation.     Probiotic Product (PROBIOTIC BLEND PO) Take 1 capsule by mouth in the morning and at bedtime.     No current facility-administered medications for this visit.    Allergies  Allergen Reactions   Amoxicillin Other (See Comments)    Reaction??   Banana Other (See Comments)    Hip pain   Bean Pod Extract Diarrhea and Other (See Comments)    Explosive diarrhea   Blackberry [Rubus Fruticosus] Other (See Comments)    Flares the patient's Mnire's disease   Broccoli [Brassica Oleracea]    Carrot Oil Diarrhea and Other (See Comments)    Explosive diarrhea   Ciprofloxacin Other (See Comments)    Reaction??   Citric Acid Diarrhea and Other (See Comments)    Explosive diarrhea   Citrus Diarrhea and Other (See Comments)    Explosive diarrhea   Corn-Containing Products Other (See Comments)    Explosive diarrhea   Egg [Egg-Derived Products] Diarrhea and Other (See Comments)    Explosive diarrhea   Milk-Related Compounds Diarrhea and Other (See Comments)    Explosive diarrhea   Other Diarrhea and Other (See Comments)    NO BERRIES WITH TINY SEEDS: Flares the patient's Mnire's disease  Bread- Explosive diarrhea   Peanut-Containing Drug Products Diarrhea and Other (See Comments)    NO LEGUMES!! NO PEAS!! Explosive diarrhea   Raspberry Other (See Comments)    Flares the patient's Mnire's disease   Salmon [Fish Oil]    Salmon-Oats-Squash [Alitraq]    Strawberry Extract Other (See Comments)    Flares the patient's Mnire's disease   Sulfa Antibiotics Other (See Comments)    Reaction??   Trimethoprim Other (See Comments)    Reaction??   Whey Diarrhea and Other (See Comments)     Explosive diarrhea    Review of Systems:  neg     Physical Exam:    BP 132/68   Pulse (!) 55   Ht 6' (1.829 m)   Wt 131 lb (59.4 kg)   SpO2 100%   BMI 17.77 kg/m  Wt Readings from Last 3 Encounters:  10/31/23 131 lb (59.4 kg)  08/20/23 154 lb 5.2 oz (70 kg)  08/15/23 154 lb 5.2 oz (70 kg)   Constitutional:  on wheelchair in no acute distress. Psychiatric: Normal mood and affect. Behavior is normal.  Abdominal: Soft, nondistended. Nontender. Bowel sounds active throughout. There are no masses palpable. No hepatomegaly. Rectal:  Deferred Neurological: Has dementia Skin: Skin is warm and dry. No rashes noted.  Data Reviewed: I have personally reviewed following labs and imaging studies  CBC:    Latest Ref Rng & Units 08/23/2023    6:10 AM 08/22/2023    6:54 AM 08/21/2023    4:26 AM  CBC  WBC 4.0 - 10.5 K/uL 8.9  18.3  21.9   Hemoglobin 13.0 - 17.0 g/dL 16.1  09.6  04.5   Hematocrit 39.0 - 52.0 % 37.7  36.5  34.6   Platelets 150 - 400 K/uL 229  205  181     CMP:    Latest Ref Rng & Units 08/23/2023    6:10 AM 08/22/2023    6:54 AM 08/21/2023    4:26 AM  CMP  Glucose 70 - 99 mg/dL 409  811  914   BUN 8 - 23 mg/dL 10  13  16    Creatinine 0.61 - 1.24 mg/dL 7.82  9.56  2.13   Sodium 135 - 145 mmol/L 134  132  134   Potassium 3.5 - 5.1 mmol/L 3.8  3.6  3.2   Chloride 98 - 111 mmol/L 102  103  103   CO2 22 - 32 mmol/L 27  22  24    Calcium 8.9 - 10.3 mg/dL 8.2  8.0  7.8        Magnus Schuller, MD 10/31/2023, 11:21 AM  Cc: Amin, Saad, MD

## 2023-11-09 ENCOUNTER — Other Ambulatory Visit (HOSPITAL_COMMUNITY): Payer: Self-pay | Admitting: Urology

## 2023-11-09 ENCOUNTER — Other Ambulatory Visit: Payer: Self-pay | Admitting: Urology

## 2023-11-09 DIAGNOSIS — R310 Gross hematuria: Secondary | ICD-10-CM

## 2023-11-12 ENCOUNTER — Ambulatory Visit (HOSPITAL_BASED_OUTPATIENT_CLINIC_OR_DEPARTMENT_OTHER)
Admission: RE | Admit: 2023-11-12 | Discharge: 2023-11-12 | Disposition: A | Discharge status: Home / Self Care | Source: Ambulatory Visit | Attending: Urology

## 2023-11-12 DIAGNOSIS — R310 Gross hematuria: Secondary | ICD-10-CM | POA: Diagnosis present

## 2023-11-12 MED ORDER — IOHEXOL 350 MG/ML SOLN
100.0000 mL | Freq: Once | INTRAVENOUS | Status: AC | PRN
  Administered 2023-11-12: 100 mL via INTRAVENOUS

## 2023-11-13 ENCOUNTER — Ambulatory Visit (INDEPENDENT_AMBULATORY_CARE_PROVIDER_SITE_OTHER): Admitting: Podiatry

## 2023-11-13 ENCOUNTER — Encounter: Payer: Self-pay | Admitting: Podiatry

## 2023-11-13 DIAGNOSIS — B351 Tinea unguium: Secondary | ICD-10-CM | POA: Diagnosis not present

## 2023-11-13 DIAGNOSIS — M79674 Pain in right toe(s): Secondary | ICD-10-CM

## 2023-11-13 DIAGNOSIS — M79675 Pain in left toe(s): Secondary | ICD-10-CM

## 2023-11-13 NOTE — Progress Notes (Signed)
 This patient presents to the office with chief complaint of long thick painful nails.  Patient says the nails are painful walking and wearing shoes.  This patient is unable to self treat.  This patient is unable to trim his  nails since she is unable to reach his  nails.  he presents to the office for preventative foot care services.  General Appearance  Alert, conversant and in no acute stress.  Vascular  Dorsalis pedis and posterior tibial  pulses are  weakly palpable  bilaterally.  Capillary return is within normal limits  bilaterally. Temperature is within normal limits  bilaterally.  Neurologic  Senn-Weinstein monofilament wire test within normal limits  bilaterally. Muscle power within normal limits bilaterally.  Nails Thick disfigured discolored nails with subungual debris  from hallux to fifth toes bilaterally. No evidence of bacterial infection or drainage bilaterally.   Orthopedic  No limitations of motion  feet .  No crepitus or effusions noted.  No bony pathology or digital deformities noted.  Skin  normotropic skin with no porokeratosis noted bilaterally.  No signs of infections or ulcers noted.     Onychomycosis  Nails  B/L.  Pain in right toes  Pain in left toes  Debridement of nails both feet followed trimming the nails with dremel tool.     RTC  10 weeks    Ruffin Cotton DPM

## 2023-11-16 LAB — POCT I-STAT CREATININE: Creatinine, Ser: 0.7 mg/dL (ref 0.61–1.24)

## 2023-11-24 ENCOUNTER — Telehealth: Payer: Self-pay | Admitting: Gastroenterology

## 2023-11-24 NOTE — Telephone Encounter (Signed)
 Inbound call from patient's daughter, states she would like Dr. Venice Gillis to review recent CT results done by [facility]. And she would like to know if those results warrant patient to continue using Miralax  or if he can stop.

## 2023-11-27 NOTE — Telephone Encounter (Signed)
 Please see note below and advise

## 2023-11-28 NOTE — Telephone Encounter (Signed)
 CT reviewed. Still shows significant constipation especially retained stool in the rectum.  No definite obstruction Please continue MiraLAX  17 g p.o. daily If having any rectal discomfort or feeling of incomplete evacuation, please give enema. RG

## 2023-11-29 NOTE — Telephone Encounter (Signed)
 Pt daughter Paul Hopkins made aware of recent results and Dr. Venice Gillis recommendations. Paul Hopkins  verbalized understanding with all questions answered.

## 2024-01-24 ENCOUNTER — Encounter: Payer: Self-pay | Admitting: Podiatry

## 2024-01-24 ENCOUNTER — Ambulatory Visit: Admitting: Podiatry

## 2024-01-24 DIAGNOSIS — B351 Tinea unguium: Secondary | ICD-10-CM | POA: Diagnosis not present

## 2024-01-24 DIAGNOSIS — M79674 Pain in right toe(s): Secondary | ICD-10-CM

## 2024-01-24 DIAGNOSIS — M79675 Pain in left toe(s): Secondary | ICD-10-CM | POA: Diagnosis not present

## 2024-01-24 NOTE — Progress Notes (Signed)
 This patient presents to the office with chief complaint of long thick painful nails.  Patient says the nails are painful walking and wearing shoes.  This patient is unable to self treat.  This patient is unable to trim his  nails since she is unable to reach his  nails.  he presents to the office for preventative foot care services.  General Appearance  Alert, conversant and in no acute stress.  Vascular  Dorsalis pedis and posterior tibial  pulses are  weakly palpable  bilaterally.  Capillary return is within normal limits  bilaterally. Temperature is within normal limits  bilaterally.  Neurologic  Senn-Weinstein monofilament wire test within normal limits  bilaterally. Muscle power within normal limits bilaterally.  Nails Thick disfigured discolored nails with subungual debris  from hallux to fifth toes bilaterally. No evidence of bacterial infection or drainage bilaterally.   Orthopedic  No limitations of motion  feet .  No crepitus or effusions noted.  No bony pathology or digital deformities noted.  Skin  normotropic skin with no porokeratosis noted bilaterally.  No signs of infections or ulcers noted.     Onychomycosis  Nails  B/L.  Pain in right toes  Pain in left toes  Debridement of nails both feet followed trimming the nails with dremel tool.     RTC  10 weeks    Cordella Bold DPM

## 2024-04-03 ENCOUNTER — Encounter: Payer: Self-pay | Admitting: Podiatry

## 2024-04-03 ENCOUNTER — Ambulatory Visit (INDEPENDENT_AMBULATORY_CARE_PROVIDER_SITE_OTHER): Admitting: Podiatry

## 2024-04-03 DIAGNOSIS — M79675 Pain in left toe(s): Secondary | ICD-10-CM

## 2024-04-03 DIAGNOSIS — B351 Tinea unguium: Secondary | ICD-10-CM

## 2024-04-03 DIAGNOSIS — M79674 Pain in right toe(s): Secondary | ICD-10-CM

## 2024-04-03 NOTE — Progress Notes (Signed)
 This patient presents to the office with chief complaint of long thick painful nails.  Patient says the nails are painful walking and wearing shoes.  This patient is unable to self treat.  This patient is unable to trim his  nails since she is unable to reach his  nails.  he presents to the office for preventative foot care services.  General Appearance  Alert, conversant and in no acute stress.  Vascular  Dorsalis pedis and posterior tibial  pulses are  weakly palpable  bilaterally.  Capillary return is within normal limits  bilaterally. Temperature is within normal limits  bilaterally.  Neurologic  Senn-Weinstein monofilament wire test within normal limits  bilaterally. Muscle power within normal limits bilaterally.  Nails Thick disfigured discolored nails with subungual debris  from hallux to fifth toes bilaterally. No evidence of bacterial infection or drainage bilaterally.   Orthopedic  No limitations of motion  feet .  No crepitus or effusions noted.  No bony pathology or digital deformities noted.  Skin  normotropic skin with no porokeratosis noted bilaterally.  No signs of infections or ulcers noted.     Onychomycosis  Nails  B/L.  Pain in right toes  Pain in left toes  Debridement of nails both feet followed trimming the nails with dremel tool.     RTC  10 weeks    Cordella Bold DPM

## 2024-06-12 ENCOUNTER — Ambulatory Visit: Admitting: Podiatry

## 2024-06-18 ENCOUNTER — Encounter: Payer: Self-pay | Admitting: Podiatry

## 2024-06-18 ENCOUNTER — Ambulatory Visit (INDEPENDENT_AMBULATORY_CARE_PROVIDER_SITE_OTHER): Admitting: Podiatry

## 2024-06-18 DIAGNOSIS — B351 Tinea unguium: Secondary | ICD-10-CM

## 2024-06-18 DIAGNOSIS — M79674 Pain in right toe(s): Secondary | ICD-10-CM | POA: Diagnosis not present

## 2024-06-18 DIAGNOSIS — M79675 Pain in left toe(s): Secondary | ICD-10-CM

## 2024-06-18 NOTE — Progress Notes (Signed)
 This patient presents to the office with chief complaint of long thick painful nails.  Patient says the nails are painful walking and wearing shoes.  This patient is unable to self treat.  This patient is unable to trim his  nails since she is unable to reach his  nails.  he presents to the office for preventative foot care services.  General Appearance  Alert, conversant and in no acute stress.  Vascular  Dorsalis pedis and posterior tibial  pulses are  weakly palpable  bilaterally.  Capillary return is within normal limits  bilaterally. Temperature is within normal limits  bilaterally.  Neurologic  Senn-Weinstein monofilament wire test within normal limits  bilaterally. Muscle power within normal limits bilaterally.  Nails Thick disfigured discolored nails with subungual debris  from hallux to fifth toes bilaterally. No evidence of bacterial infection or drainage bilaterally.   Orthopedic  No limitations of motion  feet .  No crepitus or effusions noted.  No bony pathology or digital deformities noted.  Skin  normotropic skin with no porokeratosis noted bilaterally.  No signs of infections or ulcers noted.     Onychomycosis  Nails  B/L.  Pain in right toes  Pain in left toes  Debridement of nails both feet followed trimming the nails with dremel tool.     RTC  10 weeks    Cordella Bold DPM

## 2024-08-29 ENCOUNTER — Ambulatory Visit: Admitting: Podiatry
